# Patient Record
Sex: Male | Born: 1990 | Race: White | Hispanic: Yes | Marital: Married | Smoking: Never smoker
Health system: Southern US, Community
[De-identification: ages and names within clinical notes are randomized; demographics above are authoritative.]

---
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
## 2011-02-09 ENCOUNTER — Emergency Department (HOSPITAL_COMMUNITY): Payer: Self-pay

## 2011-02-09 ENCOUNTER — Emergency Department (HOSPITAL_COMMUNITY)
Admission: EM | Admit: 2011-02-09 | Discharge: 2011-02-09 | Disposition: A | Discharge status: Home / Self Care | Payer: Self-pay | Attending: Emergency Medicine | Admitting: Emergency Medicine

## 2011-02-09 DIAGNOSIS — R10816 Epigastric abdominal tenderness: Secondary | ICD-10-CM | POA: Insufficient documentation

## 2011-02-09 DIAGNOSIS — K3189 Other diseases of stomach and duodenum: Secondary | ICD-10-CM | POA: Insufficient documentation

## 2011-02-09 DIAGNOSIS — R51 Headache: Secondary | ICD-10-CM | POA: Insufficient documentation

## 2011-02-09 DIAGNOSIS — R110 Nausea: Secondary | ICD-10-CM | POA: Insufficient documentation

## 2011-02-09 DIAGNOSIS — R1013 Epigastric pain: Secondary | ICD-10-CM | POA: Insufficient documentation

## 2011-02-09 DIAGNOSIS — R109 Unspecified abdominal pain: Secondary | ICD-10-CM

## 2011-02-09 DIAGNOSIS — R748 Abnormal levels of other serum enzymes: Secondary | ICD-10-CM | POA: Insufficient documentation

## 2011-02-09 DIAGNOSIS — M549 Dorsalgia, unspecified: Secondary | ICD-10-CM | POA: Insufficient documentation

## 2011-02-09 LAB — DIFFERENTIAL
Basophils Absolute: 0 10*3/uL (ref 0.0–0.1)
Basophils Relative: 0 % (ref 0–1)
Lymphs Abs: 1.8 10*3/uL (ref 0.7–4.0)
Monocytes Absolute: 0.6 10*3/uL (ref 0.1–1.0)
Monocytes Relative: 10 % (ref 3–12)
Neutro Abs: 3.7 10*3/uL (ref 1.7–7.7)
Neutrophils Relative %: 59 % (ref 43–77)

## 2011-02-09 LAB — LIPASE, BLOOD: Lipase: 653 U/L — ABNORMAL HIGH (ref 11–59)

## 2011-02-09 LAB — CBC
HCT: 39.4 % (ref 39.0–52.0)
Hemoglobin: 13.8 g/dL (ref 13.0–17.0)
MCH: 30.3 pg (ref 26.0–34.0)
MCHC: 35 g/dL (ref 30.0–36.0)
MCV: 86.6 fL (ref 78.0–100.0)
Platelets: 122 10*3/uL — ABNORMAL LOW (ref 150–400)
RBC: 4.55 MIL/uL (ref 4.22–5.81)
RDW: 12 % (ref 11.5–15.5)
WBC: 6.2 10*3/uL (ref 4.0–10.5)

## 2011-02-09 LAB — COMPREHENSIVE METABOLIC PANEL
Albumin: 4 g/dL (ref 3.5–5.2)
Alkaline Phosphatase: 76 U/L (ref 39–117)
BUN: 12 mg/dL (ref 6–23)
CO2: 26 mEq/L (ref 19–32)
Calcium: 9 mg/dL (ref 8.4–10.5)
Chloride: 104 mEq/L (ref 96–112)
Creatinine, Ser: 0.71 mg/dL (ref 0.50–1.35)
GFR calc Af Amer: >90 mL/min (ref >90)
GFR calc non Af Amer: >90 mL/min (ref >90)
Glucose, Bld: 111 mg/dL — ABNORMAL HIGH (ref 70–99)
Potassium: 3.3 mEq/L — ABNORMAL LOW (ref 3.5–5.1)
Sodium: 138 mEq/L (ref 135–145)
Total Bilirubin: 0.3 mg/dL (ref 0.3–1.2)

## 2011-02-09 MED ORDER — GI COCKTAIL ~~LOC~~
ORAL | Status: AC
  Filled 2011-02-09: qty 30

## 2011-02-09 MED ORDER — OMEPRAZOLE 20 MG PO CPDR: DELAYED_RELEASE_CAPSULE | ORAL | Status: DC

## 2011-02-09 MED ORDER — GI COCKTAIL ~~LOC~~
30.0000 mL | Freq: Once | ORAL | Status: AC
  Administered 2011-02-09: 30 mL via ORAL

## 2011-02-09 NOTE — ED Provider Notes (Signed)
Medical screening examination/treatment/procedure(s) were performed by non-physician practitioner and as supervising physician I was immediately available for consultation/collaboration.   Glynn Octave, MD 02/09/11 1048

## 2011-02-09 NOTE — ED Provider Notes (Signed)
The patient is feeling well. Abdomen is minimally tender. Will discharge home with primary care referrals. Give Prilosec for symptoms of dyspepsia.   Rodena Medin, PA 02/09/11 [phone]

## 2011-02-09 NOTE — ED Provider Notes (Signed)
History     CSN: [id number] Arrival date & time: 02/09/2011  5:27 AM   First MD Initiated Contact with Patient 02/09/11 0529      No chief complaint on file.   (Consider location/radiation/quality/duration/timing/severity/associated sxs/prior treatment) HPI Comments: Patient does admit to using a BC powder for headaches several days ago but generally does not use over-the-counter pain medicines. He denies any changes in his bowel habits including blood in stool or dark tarry stools. The pain is epigastric in location it is intermittent over the last 3 days it seems to get worse at night when he lays down for the night. He has radiation to his back is burning in quality it seems to get better when he gets up and about and walks around. He denies pain with eating  Patient is a 20 y.o. male presenting with abdominal pain. The history is provided by the patient.  Abdominal Pain The primary symptoms of the illness include abdominal pain and nausea. The primary symptoms of the illness do not include fever, fatigue, shortness of breath, vomiting, diarrhea, hematemesis, hematochezia or dysuria. Episode onset: 3 days ago. The onset of the illness was gradual. The problem has been gradually worsening.  The pain came on gradually. The abdominal pain has been gradually worsening since its onset. The abdominal pain is located in the epigastric region. Pain radiation: back. The severity of the abdominal pain is 5/10. Relieved by: sitting up and walking. Exacerbated by: supine position and time of night (after 5 PM gets worse)    No past medical history on file.  No past surgical history on file.  No family history on file.  History  Substance Use Topics  . Smoking status: Not on file  . Smokeless tobacco: Not on file  . Alcohol Use: Not on file      Review of Systems  Constitutional: Negative for fever and fatigue.  Respiratory: Negative for shortness of breath.   Gastrointestinal: Positive  for nausea and abdominal pain. Negative for vomiting, diarrhea, hematochezia and hematemesis.  Genitourinary: Negative for dysuria.  All other systems reviewed and are negative.    Allergies  Review of patient's allergies indicates not on file.  Home Medications  No current outpatient prescriptions on file.  BP 147/75  Pulse 57  Temp(Src) 97.9 F (36.6 C) (Oral)  Resp 18  SpO2 99%  Physical Exam  Nursing note and vitals reviewed. Constitutional: He appears well-developed and well-nourished. No distress.  HENT:  Head: Normocephalic and atraumatic.  Mouth/Throat: Oropharynx is clear and moist. No oropharyngeal exudate.  Eyes: Conjunctivae and EOM are normal. Pupils are equal, round, and reactive to light. Right eye exhibits no discharge. Left eye exhibits no discharge. No scleral icterus.  Neck: Normal range of motion. Neck supple. No JVD present. No thyromegaly present.  Cardiovascular: Normal rate, regular rhythm, normal heart sounds and intact distal pulses.  Exam reveals no gallop and no friction rub.   No murmur heard. Pulmonary/Chest: Effort normal and breath sounds normal. No respiratory distress. He has no wheezes. He has no rales.  Abdominal: Soft. Bowel sounds are normal. He exhibits no distension and no mass. There is tenderness ( Mild epigastric tenderness to palpation without masses. No right upper quadrant, right lower quadrant tenderness, non-peritoneal). There is no rebound and no guarding.  Musculoskeletal: Normal range of motion. He exhibits no edema and no tenderness.  Lymphadenopathy:    He has no cervical adenopathy.  Neurological: He is alert. Coordination normal.  Skin: Skin  is warm and dry. No rash noted. No erythema.  Psychiatric: He has a normal mood and affect. His behavior is normal.    ED Course  Procedures (including critical care time)   Labs Reviewed  CBC  DIFFERENTIAL  COMPREHENSIVE METABOLIC PANEL  LIPASE, BLOOD   No results  found.   No diagnosis found.    MDM  Overall patient is well appearing with normal vital signs. Will attempt a GI cocktail and laboratory workup to rule out pancreatitis or cholecystitis though less likely.    Pt states that his pain in the epigastrium and chest is gone almost immediately with the GI cocktail but has persistent back pain - mild.  Lipase elevated c/w pancreatitis - US pending to r/o other source of pancreatitis.  Change of shift - care signed out to Dr. Carlena Bjornstad, MD 02/09/11 [phone]

## 2011-04-24 ENCOUNTER — Emergency Department (HOSPITAL_COMMUNITY)
Admission: EM | Admit: 2011-04-24 | Discharge: 2011-04-24 | Disposition: A | Discharge status: Home / Self Care | Payer: Self-pay | Attending: Emergency Medicine | Admitting: Emergency Medicine

## 2011-04-24 DIAGNOSIS — F1010 Alcohol abuse, uncomplicated: Secondary | ICD-10-CM | POA: Insufficient documentation

## 2011-04-24 DIAGNOSIS — F10929 Alcohol use, unspecified with intoxication, unspecified: Secondary | ICD-10-CM

## 2011-04-24 MED ORDER — ONDANSETRON 8 MG PO TBDP
8.0000 mg | ORAL_TABLET | ORAL | Status: AC
  Administered 2011-04-24: 8 mg via ORAL
  Filled 2011-04-24: qty 1

## 2011-04-24 NOTE — ED Provider Notes (Signed)
History     CSN: [id number]  Arrival date & time 04/24/11  2130   First MD Initiated Contact with Patient 04/24/11 [phone]      Chief Complaint  Patient presents with  . Alcohol Intoxication    (Consider location/radiation/quality/duration/timing/severity/associated sxs/prior treatment) HPI Patient presenting with alcohol intoxication. Per family and patient this was his 20st birthday and he drank several shots of liquor. Family became concerned when he became less responsive and had an episode of emesis. Patient endorses drink alcohol denies using any other substances. He had no other symptoms of illness prior to drink alcohol. He denies any fevers or chills or abdominal pain. Upon evaluation in the ED is somnolent but awake and answering questions. However he is not able to contribute much further to the history. There is no history of, or head injury.  No past medical history on file.  No past surgical history on file.  No family history on file.  History  Substance Use Topics  . Smoking status: Never Smoker   . Smokeless tobacco: Not on file  . Alcohol Use: No      Review of Systems ROS reviewed and otherwise negative except for mentioned in HPI  Allergies  Review of patient's allergies indicates no known allergies.  Home Medications  No current outpatient prescriptions on file.  BP 118/70  Pulse 87  Temp(Src) 97.8 F (36.6 C) (Oral)  Resp 18  SpO2 99% Vitals reviewed Physical Exam Physical Examination: General appearance - alert, sleepy but otherwise well appearing, and in no distress Mental status - alert, oriented to person, place, and time Head- NCAT Eyes - pupils equal and reactive, no conjunctival injection or scleral icterus Mouth - mucous membranes moist, pharynx normal without lesions Chest - clear to auscultation, no wheezes, rales or rhonchi, symmetric air entry Heart - normal rate, regular rhythm, normal S1, S2, no murmurs, rubs, clicks or  gallops Abdomen - soft, nontender, nondistended, no masses or organomegaly Neurological - alert, oriented, normal speech, no focal findings, gait normal Musculoskeletal - no joint tenderness, deformity or swelling Extremities - peripheral pulses normal, no pedal edema, no clubbing or cyanosis Skin - normal coloration and turgor, no rashes, brisk cap refill  ED Course  Procedures (including critical care time) 5:24 AM- Pt awake, ambulating, has tolerated fluid trial in the ED.  Pt has family members here who will go home with him.  Given zofran for nausea  Labs Reviewed - No data to display No results found.   1. Alcohol intoxication       MDM  Patient presents after report of alcohol ingestion with resultant somnolence and an episode of emesis. Upon evaluation in the ED he is sleeping but easily arousable. He has tolerated fluid orally without any vomiting. He's up and laboratory. There is no suspicion by history or physical for any trauma or other serious acute medical condition at this time. There family members at the bedside who agreed to take him home. He was discharged with strict return precautions and is agreeable with the plan.        Ethelda Chick, MD 04/25/11 [phone]

## 2011-04-24 NOTE — ED Notes (Signed)
EMS called home.  Found patient throwing up in towel.   Family states that patient had been drinking and became  Less responsive.  BP 112/80 HR 76 RR 16

## 2011-04-24 NOTE — ED Notes (Signed)
Patient is alert and oriented x3.  He was given DC instructions and follow up visit instructions.  Patient gave verbal understanding.  He was DC ambulatory under his own power to home.  V/S stable.  He was not showing any signs of distress on DC 

## 2012-03-27 ENCOUNTER — Encounter (HOSPITAL_COMMUNITY): Payer: Self-pay | Admitting: *Deleted

## 2012-03-27 ENCOUNTER — Emergency Department (HOSPITAL_COMMUNITY)
Admission: EM | Admit: 2012-03-27 | Discharge: 2012-03-27 | Disposition: A | Discharge status: Home / Self Care | Payer: Self-pay | Attending: Emergency Medicine | Admitting: Emergency Medicine

## 2012-03-27 DIAGNOSIS — H16139 Photokeratitis, unspecified eye: Secondary | ICD-10-CM | POA: Insufficient documentation

## 2012-03-27 DIAGNOSIS — H5789 Other specified disorders of eye and adnexa: Secondary | ICD-10-CM | POA: Insufficient documentation

## 2012-03-27 DIAGNOSIS — R51 Headache: Secondary | ICD-10-CM | POA: Insufficient documentation

## 2012-03-27 DIAGNOSIS — H53149 Visual discomfort, unspecified: Secondary | ICD-10-CM | POA: Insufficient documentation

## 2012-03-27 MED ORDER — OXYCODONE-ACETAMINOPHEN 5-325 MG PO TABS
2.0000 | ORAL_TABLET | Freq: Once | ORAL | Status: AC
  Administered 2012-03-27: 2 via ORAL
  Filled 2012-03-27: qty 2

## 2012-03-27 MED ORDER — TETRACAINE HCL 0.5 % OP SOLN: 2.0000 [drp] | Freq: Once | OPHTHALMIC | Status: DC

## 2012-03-27 MED ORDER — OXYCODONE-ACETAMINOPHEN 5-325 MG PO TABS: 1.0000 | ORAL_TABLET | ORAL | Status: DC | PRN

## 2012-03-27 MED ORDER — FLUORESCEIN SODIUM 1 MG OP STRP: 1.0000 | ORAL_STRIP | Freq: Once | OPHTHALMIC | Status: DC

## 2012-03-27 MED ORDER — TOBRAMYCIN 0.3 % OP SOLN
2.0000 [drp] | OPHTHALMIC | Status: DC
  Administered 2012-03-27: 2 [drp] via OPHTHALMIC
  Filled 2012-03-27: qty 5

## 2012-03-27 MED ORDER — FLUORESCEIN SODIUM 1 MG OP STRP
ORAL_STRIP | OPHTHALMIC | Status: AC
  Filled 2012-03-27: qty 2

## 2012-03-27 MED ORDER — TETRACAINE HCL 0.5 % OP SOLN
OPHTHALMIC | Status: AC
  Filled 2012-03-27: qty 2

## 2012-03-27 NOTE — ED Provider Notes (Signed)
Medical screening examination/treatment/procedure(s) were performed by non-physician practitioner and as supervising physician I was immediately available for consultation/collaboration.  Vayda Dungee M Isac Lincks, MD 03/27/12 0720 

## 2012-03-27 NOTE — ED Provider Notes (Signed)
History     CSN: [id number]  Arrival date & time 03/27/12  0415   First MD Initiated Contact with Patient 03/27/12 0423      Chief Complaint  Patient presents with  . Eye Pain   HPI  History provided by the patient. Patient is a 22 year old male with no significant PMH who presents with complaints of bilateral high pain and redness. 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 He had a tinted face shield. He denies having any foreign objects in his face or eye area. Patient went home and several hours after returning home to have increasing pain and irritation to both eyes. Patient tried using eyedrops without any improvements. He has not used any other treatments for symptoms. Pain is worse with bright lights. Denies any other aggravating factors. Denies any other associated symptoms. Denies double vision, dizziness, nausea or vomiting.    History reviewed. No pertinent past medical history.  History reviewed. No pertinent past surgical history.  History reviewed. No pertinent family history.  History  Substance Use Topics  . Smoking status: Never Smoker   . Smokeless tobacco: Not on file  . Alcohol Use: No      Review of Systems  Eyes: Positive for photophobia, pain and redness. Negative for itching.  Neurological: Positive for headaches. Negative for dizziness and light-headedness.  All other systems reviewed and are negative.    Allergies  Review of patient's allergies indicates no known allergies.  Home Medications  No current outpatient prescriptions on file.  BP 120/74  Pulse 86  Temp 98 F (36.7 C) (Oral)  Resp 16  SpO2 100%  Physical Exam  Nursing note and vitals reviewed. Constitutional: He appears well-developed and well-nourished. No distress.  HENT:  Head: Normocephalic.  Eyes: EOM are normal. Pupils are equal, round, and reactive to light. No foreign bodies found. Right conjunctiva is  injected. Right conjunctiva has no hemorrhage. Left conjunctiva is injected. Left conjunctiva has no hemorrhage.  Slit lamp exam:      The right eye shows fluorescein uptake. The right eye shows no corneal abrasion, no foreign body, no hyphema and no hypopyon.       The left eye shows fluorescein uptake. The left eye shows no corneal abrasion, no foreign body, no hyphema and no hypopyon.       Diffuse speckled fluorescein uptake over bilateral corneas.  Cardiovascular: Normal rate and regular rhythm.   No murmur heard. Pulmonary/Chest: Effort normal and breath sounds normal.  Neurological: He is alert.  Skin: Skin is warm.  Psychiatric: He has a normal mood and affect. His behavior is normal.    ED Course  Procedures     1. Ultraviolet keratitis       MDM  4:30 AM patient seen and evaluated. Patient appears uncomfortable with sweatshirt hood over his eyes.   Patient with significant improvement after tetracaine use. 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 Patient given tobramycin eye drops, prescriptions for Percocet and ophthalmology referral.       Angus Seller, [place] 03/27/12 [phone]

## 2012-03-27 NOTE — ED Notes (Signed)
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 Pt states that the irriation last for several hours so he decided to come in. Both eyes are irrated.

## 2013-01-11 IMAGING — US US ABDOMEN COMPLETE
1 series · 14 of 25 positions shown · non-contrast
Comparison: None

CLINICAL DATA: Pancreatitis

ULTRASOUND ABDOMEN:
TECHNIQUE: Sonography of upper abdominal structures was performed.

[Series 1: us abdomen complete · 0.26mm/px · 14 of 83 slices shown]
[im 1/83]
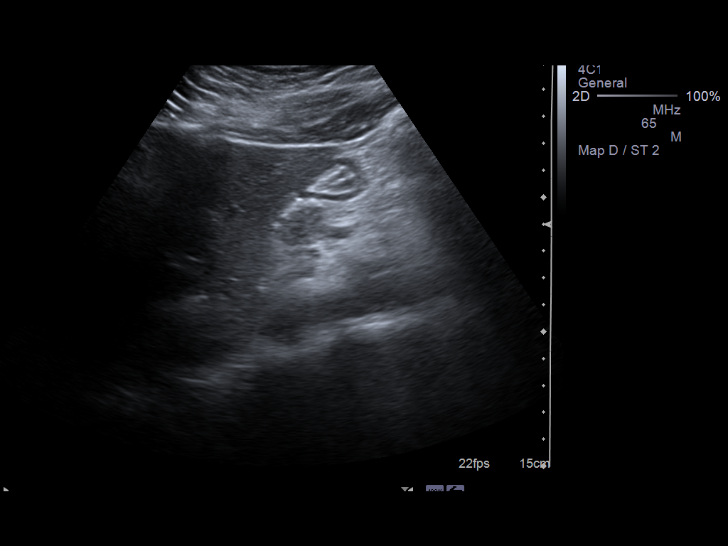
[im 7/83]
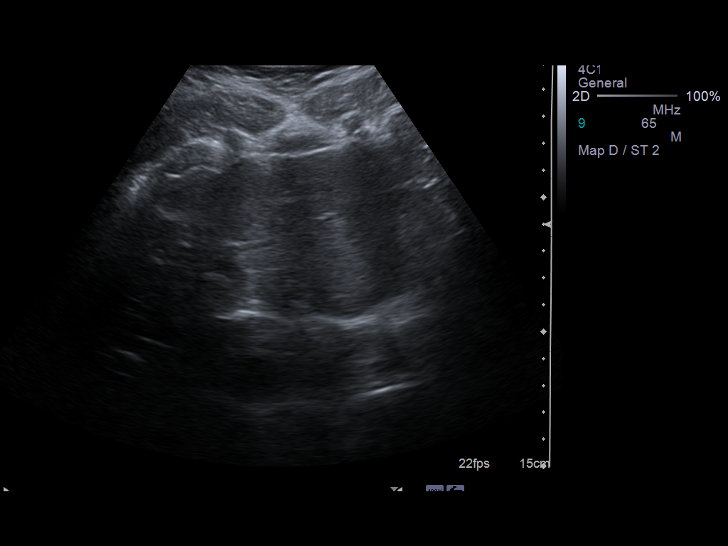
[im 14/83]
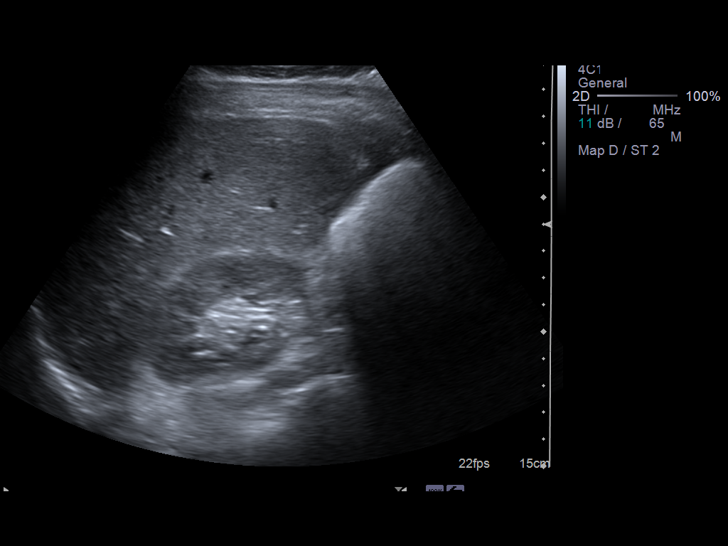
[im 21/83]
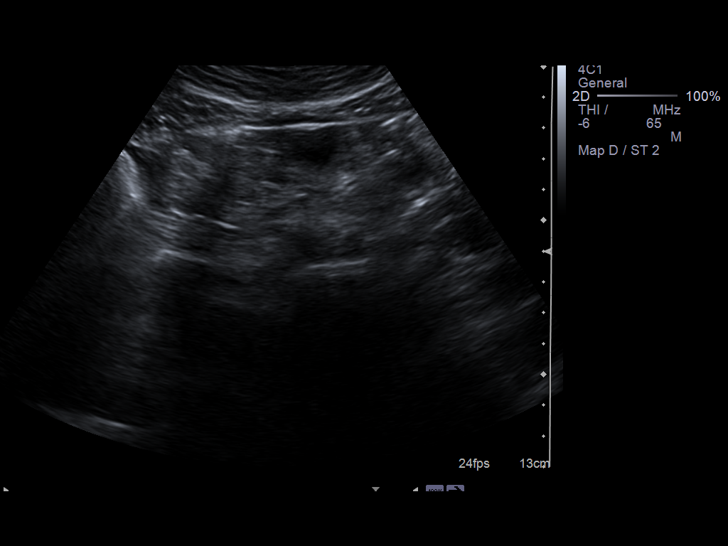
[im 28/83]
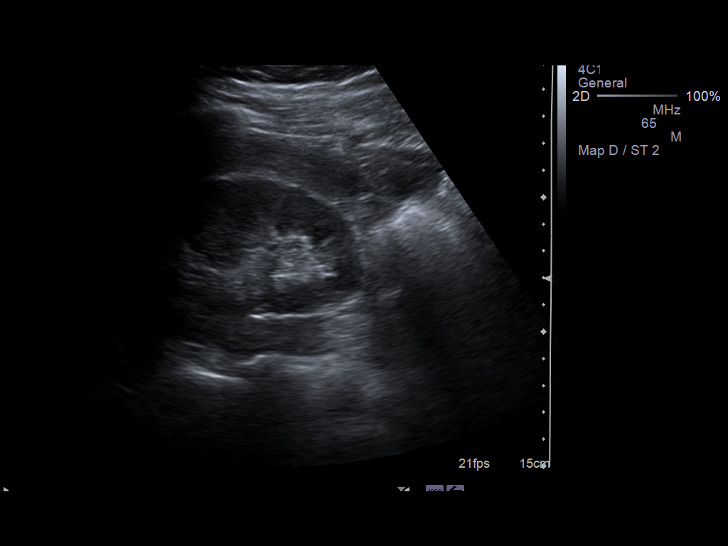
[im 31/83]
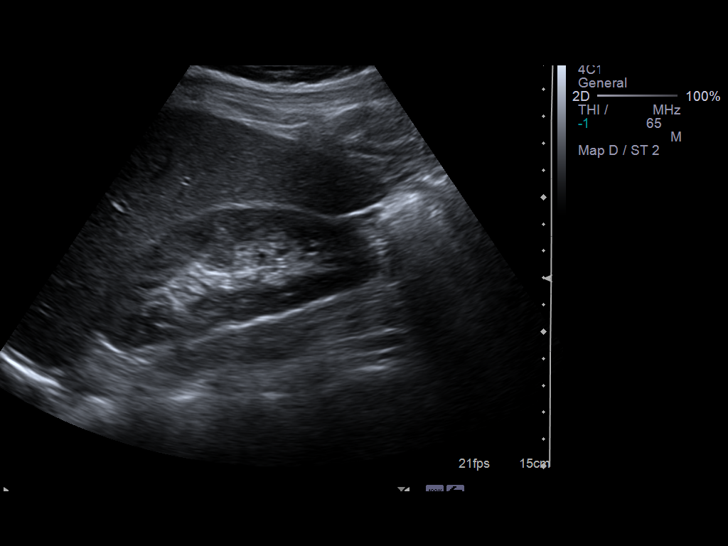
[im 38/83]
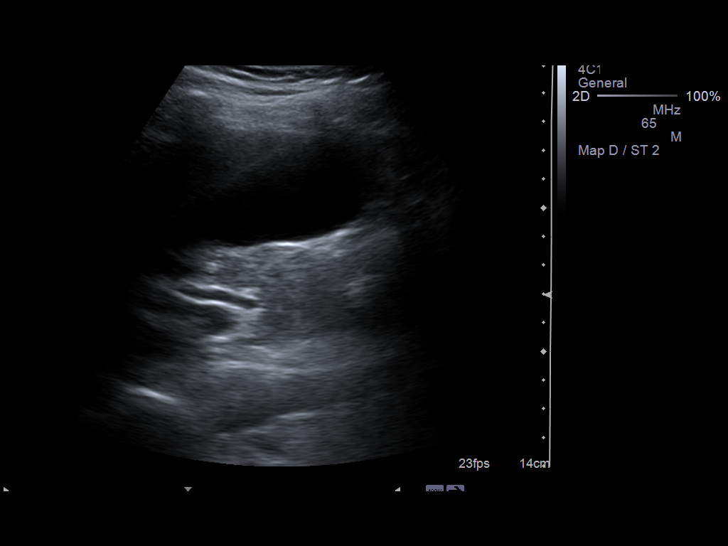
[im 45/83]
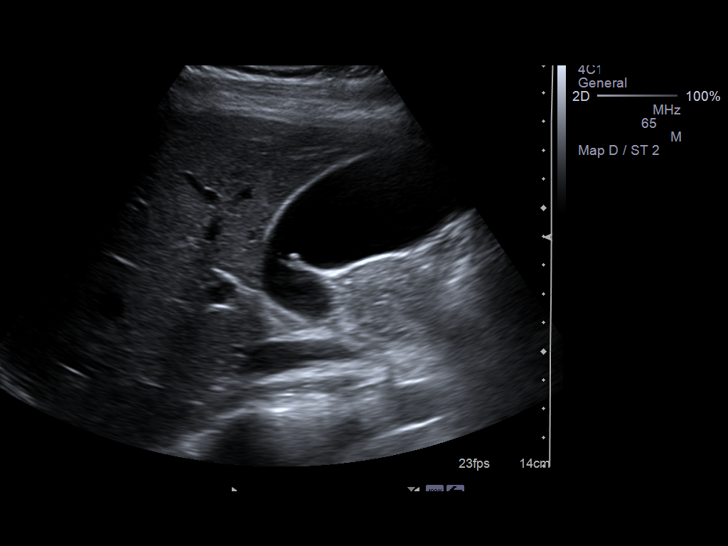
[im 52/83]
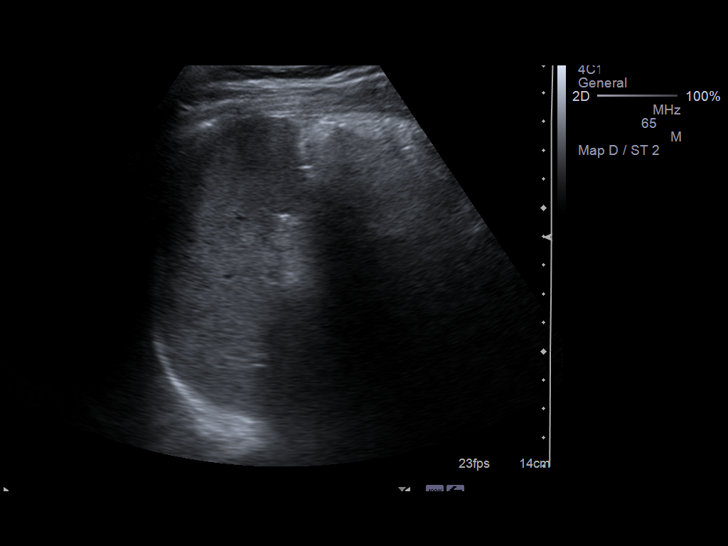
[im 55/83]
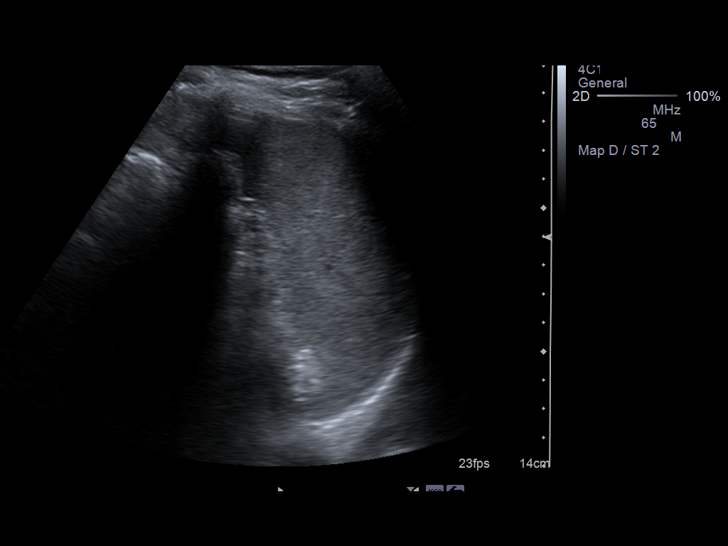
[im 62/83]
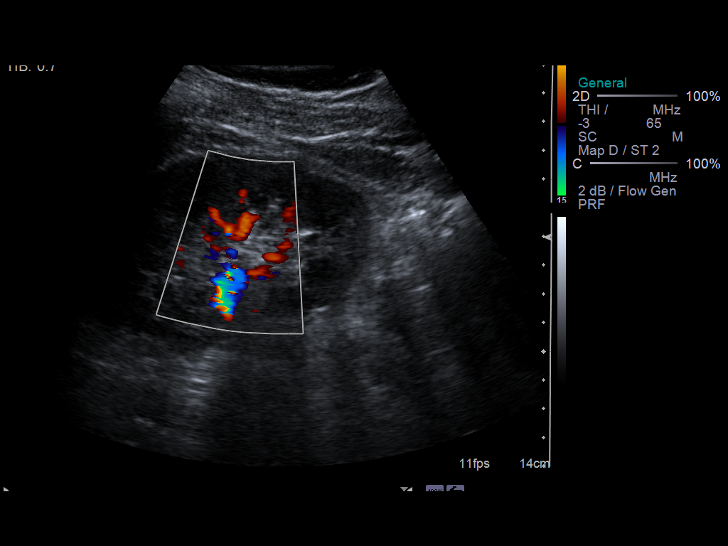
[im 69/83]
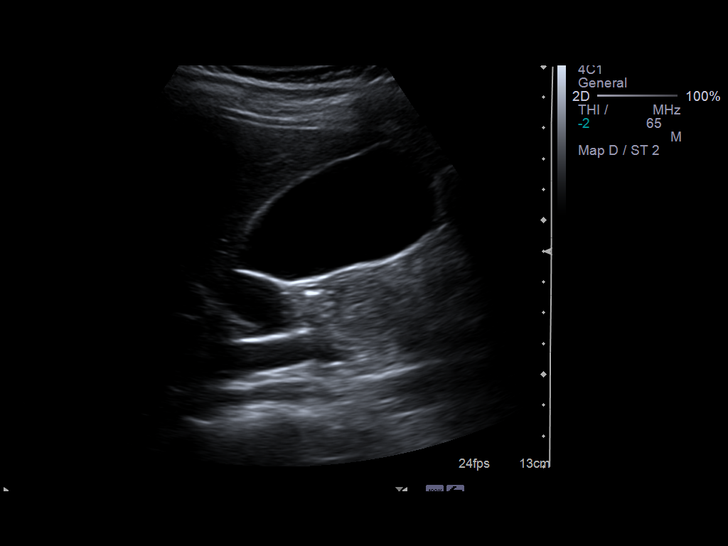
[im 76/83]
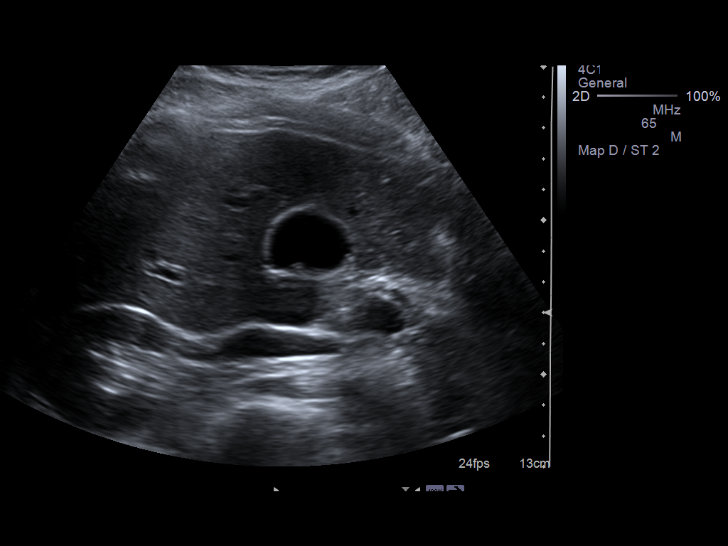
[im 83/83]
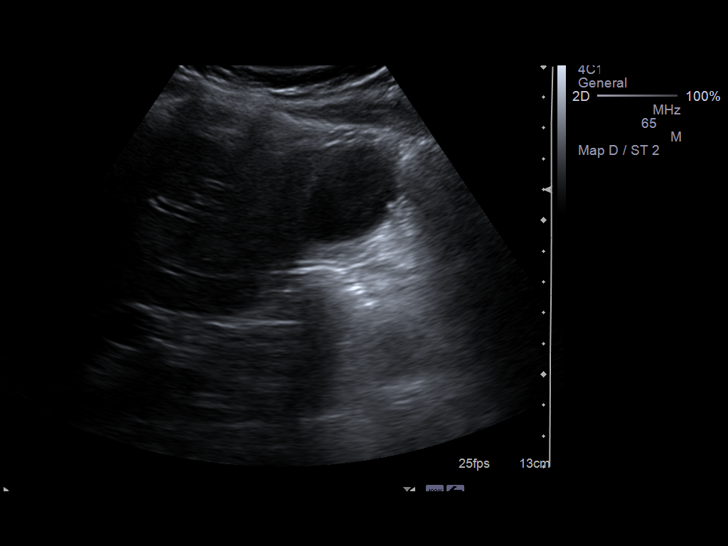

[14 of 25 positions shown; findings below may reference images not displayed]

Gallbladder:  Two gallbladder polyps 3 and 4 mm in diameter, non
mobile and nonshadowing.  No gallbladder wall thickening, definite
shadowing calculi, pericholecystic fluid, or sonographic Murphy's
sign.

Common bile duct:  Normal caliber 5 mm diameter

Liver:  Normal appearance

IVC:  Normal appearance

Pancreas:  Head, body, and proximal tail normal appearance, distal
tail obscured by bowel gas.

Spleen:  Normal appearance, 6.7 cm length

Right kidney:  10.7 cm length. Normal morphology without mass or
hydronephrosis.

Left kidney:  10.3 cm length. Normal morphology without mass or
hydronephrosis.

Aorta:  Normal caliber

Other:  No free fluid
IMPRESSION: Tiny gallbladder polyps.
Nonvisualization of distal pancreatic tail due to bowel gas.
Otherwise negative exam.

## 2014-12-29 ENCOUNTER — Encounter (HOSPITAL_COMMUNITY): Payer: Self-pay | Admitting: Adult Health

## 2014-12-29 ENCOUNTER — Emergency Department (HOSPITAL_COMMUNITY)
Admission: EM | Admit: 2014-12-29 | Discharge: 2014-12-29 | Disposition: A | Discharge status: Home / Self Care | Payer: Self-pay | Attending: Emergency Medicine | Admitting: Emergency Medicine

## 2014-12-29 DIAGNOSIS — F4320 Adjustment disorder, unspecified: Secondary | ICD-10-CM | POA: Insufficient documentation

## 2014-12-29 DIAGNOSIS — F4321 Adjustment disorder with depressed mood: Secondary | ICD-10-CM

## 2014-12-29 MED ORDER — HYDROXYZINE HCL 25 MG PO TABS: ORAL_TABLET | ORAL | Status: DC

## 2014-12-29 MED ORDER — HYDROXYZINE HCL 25 MG PO TABS
50.0000 mg | ORAL_TABLET | Freq: Once | ORAL | Status: AC
  Administered 2014-12-29: 50 mg via ORAL
  Filled 2014-12-29: qty 2

## 2014-12-29 NOTE — ED Notes (Signed)
Family lost father last night at our facility and is currently grieving-pt denies wanting to harm self or others. Tearful and anxious and having trouble sleeping since death last night.

## 2014-12-29 NOTE — Discharge Instructions (Signed)
You appear to be having a normal grief response. We are giving you medication that should help you sleep and get some rest at night. We are giving you information on Complicated Grief in the event your symptoms do not resolve. If your symptoms worsen return as needed.

## 2014-12-29 NOTE — ED Provider Notes (Signed)
CSN: [id number]     Arrival date & time 12/29/14  2058 History  By signing my name below, I, Jarvis Morgan, attest that this documentation has been prepared under the direction and in the presence of Kerrie Buffalo, NP Electronically Signed: Jarvis Morgan, ED Scribe. 12/29/2014. 10:45 PM.     Chief Complaint  Patient presents with  . Anxiety   The history is provided by the patient. No language interpreter was used.    HPI Comments: Shane Olson is a 24 y.o. male with no PMHx who presents to the Emergency Department complaining of sudden onset, gradually worsening anxiety that began last s/p death of pt's father. Pt states he lost his father last night at [facility] and it was unexpected. He endorses he is having associated difficulty sleeping and vomiting 1x last night. Pt has not had any vomiting since. He has also not been eating or drinking much in the past 24 hours.Pt's father was 34 years old and there is no known cause of death at this time. He had no chronic health problems that they were aware of. He denies any SI, HI, chest pain or SOB. He denies any known medical allergies.   History reviewed. No pertinent past medical history. History reviewed. No pertinent past surgical history. History reviewed. No pertinent family history. Social History  Substance Use Topics  . Smoking status: Never Smoker   . Smokeless tobacco: None  . Alcohol Use: No    Review of Systems  Respiratory: Negative for shortness of breath.   Cardiovascular: Negative for chest pain.  Gastrointestinal: Positive for vomiting (1x; now resolved).  Psychiatric/Behavioral: Positive for sleep disturbance. Negative for suicidal ideas. The patient is nervous/anxious.   All other systems reviewed and are negative.     Allergies  Review of patient's allergies indicates no known allergies.  Home Medications   Prior to Admission medications   Medication Sig Start Date End Date Taking? Authorizing Provider   hydrOXYzine (ATARAX/VISTARIL) 25 MG tablet Take one tablet PO at bedtime and may repeat once if needed. For anxiety and grief reaction 12/29/14   Janne Napoleon, NP   Triage Vitals: BP 108/92 mmHg  Pulse 85  Temp(Src) 98.1 F (36.7 C) (Oral)  Resp 16  SpO2 98%  Physical Exam  Constitutional: He is oriented to person, place, and time. He appears well-developed and well-nourished. No distress.  HENT:  Head: Normocephalic and atraumatic.  Eyes: EOM are normal. Pupils are equal, round, and reactive to light.  Sclera is erythematous due to crying Upper and lower lids with mild edema  Neck: Neck supple.  Cardiovascular: Normal rate, regular rhythm and normal heart sounds.   Pulmonary/Chest: Effort normal and breath sounds normal. He has no wheezes. He has no rales.  Abdominal: Soft. There is no tenderness.  Musculoskeletal: Normal range of motion.  Neurological: He is alert and oriented to person, place, and time. No cranial nerve deficit.  Skin: Skin is warm and dry.  Psychiatric: His speech is normal. Thought content normal. Cognition and memory are normal.  Pt is tearful and sad Appropriate behavior for dealing with loss.   Nursing note and vitals reviewed.   ED Course  Procedures (including critical care time)  DIAGNOSTIC STUDIES: Oxygen Saturation is 98% on RA, normal by my interpretation.    COORDINATION OF CARE:  10:20 PM- will d/c pt with rx for Atarax for mgmt of his grief and anxiety.  Pt advised of plan for treatment and pt agrees.  MDM  24 y.o. male with difficulty sleeping s/p death of his father last evening. Stable for d/c with Atarax to try and help with sleep. Discussed with the patient grief process, counseling and follow up. Patient voices understanding. Stable for d/c without S/I or H/I.   Final diagnoses:  Grief reaction   I personally performed the services described in this documentation, which was scribed in my presence. The recorded information has  been reviewed and is accurate.   [address] [place], NP 12/29/14 1610  Lyndal Pulley, MD 12/30/14 Moses Manners

## 2015-03-08 ENCOUNTER — Emergency Department (HOSPITAL_COMMUNITY)
Admission: EM | Admit: 2015-03-08 | Discharge: 2015-03-08 | Disposition: A | Discharge status: Other Acute Inpatient Hospital | Payer: Self-pay | Attending: Emergency Medicine | Admitting: Emergency Medicine

## 2015-03-08 ENCOUNTER — Encounter (HOSPITAL_COMMUNITY): Payer: Self-pay | Admitting: Family Medicine

## 2015-03-08 ENCOUNTER — Inpatient Hospital Stay (HOSPITAL_COMMUNITY)
Admission: AD | Admit: 2015-03-08 | Discharge: 2015-03-11 | DRG: 885 | Disposition: A | Discharge status: Home / Self Care | Payer: Federal, State, Local not specified - Other | Source: Intra-hospital | Attending: Psychiatry | Admitting: Psychiatry

## 2015-03-08 DIAGNOSIS — F919 Conduct disorder, unspecified: Secondary | ICD-10-CM | POA: Insufficient documentation

## 2015-03-08 DIAGNOSIS — F419 Anxiety disorder, unspecified: Secondary | ICD-10-CM | POA: Diagnosis present

## 2015-03-08 DIAGNOSIS — S0081XA Abrasion of other part of head, initial encounter: Secondary | ICD-10-CM | POA: Insufficient documentation

## 2015-03-08 DIAGNOSIS — Y998 Other external cause status: Secondary | ICD-10-CM | POA: Insufficient documentation

## 2015-03-08 DIAGNOSIS — Z23 Encounter for immunization: Secondary | ICD-10-CM | POA: Insufficient documentation

## 2015-03-08 DIAGNOSIS — Y92009 Unspecified place in unspecified non-institutional (private) residence as the place of occurrence of the external cause: Secondary | ICD-10-CM | POA: Insufficient documentation

## 2015-03-08 DIAGNOSIS — G4700 Insomnia, unspecified: Secondary | ICD-10-CM | POA: Diagnosis present

## 2015-03-08 DIAGNOSIS — F1520 Other stimulant dependence, uncomplicated: Secondary | ICD-10-CM

## 2015-03-08 DIAGNOSIS — F329 Major depressive disorder, single episode, unspecified: Secondary | ICD-10-CM | POA: Diagnosis not present

## 2015-03-08 DIAGNOSIS — F1410 Cocaine abuse, uncomplicated: Secondary | ICD-10-CM | POA: Insufficient documentation

## 2015-03-08 DIAGNOSIS — F1590 Other stimulant use, unspecified, uncomplicated: Secondary | ICD-10-CM | POA: Diagnosis not present

## 2015-03-08 DIAGNOSIS — F1420 Cocaine dependence, uncomplicated: Secondary | ICD-10-CM | POA: Diagnosis not present

## 2015-03-08 DIAGNOSIS — Y9389 Activity, other specified: Secondary | ICD-10-CM | POA: Insufficient documentation

## 2015-03-08 DIAGNOSIS — R454 Irritability and anger: Secondary | ICD-10-CM | POA: Insufficient documentation

## 2015-03-08 DIAGNOSIS — F322 Major depressive disorder, single episode, severe without psychotic features: Secondary | ICD-10-CM | POA: Diagnosis not present

## 2015-03-08 DIAGNOSIS — F1510 Other stimulant abuse, uncomplicated: Secondary | ICD-10-CM | POA: Insufficient documentation

## 2015-03-08 DIAGNOSIS — S0083XA Contusion of other part of head, initial encounter: Secondary | ICD-10-CM | POA: Insufficient documentation

## 2015-03-08 DIAGNOSIS — R451 Restlessness and agitation: Secondary | ICD-10-CM | POA: Insufficient documentation

## 2015-03-08 DIAGNOSIS — F32A Depression, unspecified: Secondary | ICD-10-CM | POA: Diagnosis present

## 2015-03-08 LAB — COMPREHENSIVE METABOLIC PANEL
ALT: 17 U/L (ref 17–63)
ANION GAP: 10 (ref 5–15)
AST: 24 U/L (ref 15–41)
Albumin: 4.8 g/dL (ref 3.5–5.0)
Alkaline Phosphatase: 65 U/L (ref 38–126)
BUN: 12 mg/dL (ref 6–20)
CHLORIDE: 104 mmol/L (ref 101–111)
CO2: 29 mmol/L (ref 22–32)
CREATININE: 0.94 mg/dL (ref 0.61–1.24)
Calcium: 9.5 mg/dL (ref 8.9–10.3)
Glucose, Bld: 115 mg/dL — ABNORMAL HIGH (ref 65–99)
POTASSIUM: 3.4 mmol/L — AB (ref 3.5–5.1)
SODIUM: 143 mmol/L (ref 135–145)
Total Bilirubin: 1.1 mg/dL (ref 0.3–1.2)
Total Protein: 8 g/dL (ref 6.5–8.1)

## 2015-03-08 LAB — CBC
HCT: 42.9 % (ref 39.0–52.0)
Hemoglobin: 14.9 g/dL (ref 13.0–17.0)
MCH: 31.2 pg (ref 26.0–34.0)
MCHC: 34.7 g/dL (ref 30.0–36.0)
MCV: 89.7 fL (ref 78.0–100.0)
PLATELETS: 166 10*3/uL (ref 150–400)
RBC: 4.78 MIL/uL (ref 4.22–5.81)
RDW: 12.4 % (ref 11.5–15.5)
WBC: 9.8 10*3/uL (ref 4.0–10.5)

## 2015-03-08 LAB — RAPID URINE DRUG SCREEN, HOSP PERFORMED
AMPHETAMINES: POSITIVE — AB
Barbiturates: NOT DETECTED
Benzodiazepines: NOT DETECTED
COCAINE: POSITIVE — AB
OPIATES: NOT DETECTED
Tetrahydrocannabinol: NOT DETECTED

## 2015-03-08 LAB — ETHANOL

## 2015-03-08 LAB — ACETAMINOPHEN LEVEL

## 2015-03-08 LAB — SALICYLATE LEVEL

## 2015-03-08 MED ORDER — NICOTINE 21 MG/24HR TD PT24: 21.0000 mg | MEDICATED_PATCH | Freq: Every day | TRANSDERMAL | Status: DC

## 2015-03-08 MED ORDER — IBUPROFEN 200 MG PO TABS: 600.0000 mg | ORAL_TABLET | Freq: Three times a day (TID) | ORAL | Status: DC | PRN

## 2015-03-08 MED ORDER — ZOLPIDEM TARTRATE 5 MG PO TABS: 5.0000 mg | ORAL_TABLET | Freq: Every evening | ORAL | Status: DC | PRN

## 2015-03-08 MED ORDER — TETANUS-DIPHTH-ACELL PERTUSSIS 5-2.5-18.5 LF-MCG/0.5 IM SUSP
0.5000 mL | Freq: Once | INTRAMUSCULAR | Status: AC
  Administered 2015-03-08: 0.5 mL via INTRAMUSCULAR
  Filled 2015-03-08: qty 0.5

## 2015-03-08 MED ORDER — ONDANSETRON HCL 4 MG PO TABS: 4.0000 mg | ORAL_TABLET | Freq: Three times a day (TID) | ORAL | Status: DC | PRN

## 2015-03-08 MED ORDER — LORAZEPAM 1 MG PO TABS: 1.0000 mg | ORAL_TABLET | Freq: Three times a day (TID) | ORAL | Status: DC | PRN

## 2015-03-08 MED ORDER — ALUM & MAG HYDROXIDE-SIMETH 200-200-20 MG/5ML PO SUSP: 30.0000 mL | ORAL | Status: DC | PRN

## 2015-03-08 MED ORDER — ACETAMINOPHEN 325 MG PO TABS: 650.0000 mg | ORAL_TABLET | ORAL | Status: DC | PRN
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
## 2015-03-08 NOTE — ED Notes (Signed)
Tele psych machine at bedside 
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
## 2015-03-08 NOTE — ED Notes (Signed)
Pt. To [facility] from ED ambulatory without difficulty, to room 37. Pt. Is alert and oriented, warm and dry in no distress. Pt. Denies SI, HI, and AVH. Pt. Calm and cooperative. Pt. Made aware of security cameras and Q15 minute rounds. Pt. Encouraged to let Nursing staff know of any concerns or needs.

## 2015-03-08 NOTE — ED Notes (Addendum)
Pt's belonging include: A pair of Nike tennis shoes Blue jeans, brown belt with $3 bills, wallet with [place] DL, SSN card, BOA debit master card H&R BlockWhite T shirt Black Exelon CorporationHoodie White Socks.   All belongings placed in one white belongings bag and placed at triage nurses station.

## 2015-03-08 NOTE — ED Notes (Signed)
Gave report to Baker Hughes Incorporatedary RN. Patient belongings transported with patient. Pt transported to room 37.

## 2015-03-09 ENCOUNTER — Encounter (HOSPITAL_COMMUNITY): Payer: Self-pay

## 2015-03-09 DIAGNOSIS — F1590 Other stimulant use, unspecified, uncomplicated: Secondary | ICD-10-CM

## 2015-03-09 DIAGNOSIS — F32A Depression, unspecified: Secondary | ICD-10-CM | POA: Diagnosis present

## 2015-03-09 DIAGNOSIS — F1520 Other stimulant dependence, uncomplicated: Secondary | ICD-10-CM

## 2015-03-09 DIAGNOSIS — F322 Major depressive disorder, single episode, severe without psychotic features: Principal | ICD-10-CM

## 2015-03-09 DIAGNOSIS — F329 Major depressive disorder, single episode, unspecified: Secondary | ICD-10-CM | POA: Diagnosis present

## 2015-03-09 DIAGNOSIS — F1420 Cocaine dependence, uncomplicated: Secondary | ICD-10-CM

## 2015-03-09 MED ORDER — TRAZODONE HCL 50 MG PO TABS
50.0000 mg | ORAL_TABLET | Freq: Every evening | ORAL | Status: DC | PRN
  Filled 2015-03-09: qty 1

## 2015-03-09 MED ORDER — POTASSIUM CHLORIDE CRYS ER 20 MEQ PO TBCR
20.0000 meq | EXTENDED_RELEASE_TABLET | Freq: Two times a day (BID) | ORAL | Status: AC
  Administered 2015-03-09 – 2015-03-10 (×2): 20 meq via ORAL
  Filled 2015-03-09 (×3): qty 1

## 2015-03-09 MED ORDER — FLUOXETINE HCL 10 MG PO CAPS
10.0000 mg | ORAL_CAPSULE | Freq: Every day | ORAL | Status: DC
  Administered 2015-03-09 – 2015-03-11 (×3): 10 mg via ORAL
  Filled 2015-03-09 (×7): qty 1

## 2015-03-09 MED ORDER — ACETAMINOPHEN 325 MG PO TABS: 650.0000 mg | ORAL_TABLET | Freq: Four times a day (QID) | ORAL | Status: DC | PRN

## 2015-03-09 MED ORDER — HYDROXYZINE HCL 25 MG PO TABS: 25.0000 mg | ORAL_TABLET | Freq: Three times a day (TID) | ORAL | Status: DC | PRN

## 2015-03-09 MED ORDER — ALUM & MAG HYDROXIDE-SIMETH 200-200-20 MG/5ML PO SUSP: 30.0000 mL | ORAL | Status: DC | PRN

## 2015-03-09 MED ORDER — MAGNESIUM HYDROXIDE 400 MG/5ML PO SUSP: 30.0000 mL | Freq: Every day | ORAL | Status: DC | PRN
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
## 2015-03-09 NOTE — Progress Notes (Addendum)
Patient attended AA meeting.

## 2015-03-09 NOTE — Progress Notes (Signed)
Patient ID: Shane Olson, male   DOB: 05/17/1990, 24 y.o.   MRN: [id number] D: Patient alert and cooperative. Pt reports wife and mother visited this evening. Pt hoping to discharge tomorrow and follow up OP. Pt mood/affect is anxious but pleasant. Pt denies SI/HI/AVH and pain. No acute physical distress noted.   A: Medications administered as prescribed. Emotional support given and will continue to monitor pt's progress.  R: Patient remains safe and complaint with medications.

## 2015-03-09 NOTE — Plan of Care (Signed)
Problem: Consults Goal: Depression Patient Education See Patient Education Module for education specifics.  Outcome: Progressing Nurse discussed depression/coping skills with patient.        

## 2015-03-09 NOTE — [facility] Suicide Risk Assessment (Signed)
[facility]   Nursing information obtained from:  Patient Demographic factors:  Male, Adolescent or young adult, Access to firearms Current Mental Status:  Suicidal ideation indicated by others, Self-harm behaviors Loss Factors:  Loss of significant relationship Historical Factors:  Impulsivity Risk Reduction Factors:  Employed, Positive social support, Positive therapeutic relationship Total Time spent with patient: 45 minutes Principal Problem: Depressive disorder Diagnosis:   Patient Active Problem List   Diagnosis Date Noted  . Depressive disorder [F32.9] 03/09/2015  . Cocaine use disorder, moderate, dependence (HCC) [F14.20] 03/09/2015  . 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  World Science writerHealth Organization [facility](WHO). Score between 0-7:  no or low risk or alcohol related problems. Score between 8-15:  moderate risk of alcohol related problems. Score between 16-19:  high risk of alcohol related problems. Score 20 or above:  warrants further diagnostic evaluation for alcohol dependence and treatment.   CLINICAL FACTORS:   Depression:   Comorbid alcohol abuse/dependence Impulsivity Insomnia Recent sense of peace/wellbeing Alcohol/Substance Abuse/Dependencies Unstable or Poor Therapeutic Relationship   Musculoskeletal: Strength & Muscle Tone: within normal limits Gait & Station: normal Patient leans: N/A  Psychiatric Specialty Exam: Physical Exam  ROS  Blood pressure 124/72, pulse 84, temperature 98.6 F (37 C), temperature source Oral, resp. rate 16, height 5\' 10"  (1.778 m), weight 73.029 kg (161 lb).Body mass index is 23.1 kg/(m^2).  General Appearance: Guarded  Eye Contact::  Fair  Speech:  Slow  Volume:  Decreased  Mood:  Anxious, Depressed and Irritable  Affect:  Constricted and Depressed   Thought Process:  Coherent  Orientation:  Full (Time, Place, and Person)  Thought Content:  Rumination  Suicidal Thoughts:  No  Homicidal Thoughts:  No  Memory:  Immediate;   Fair Recent;   Fair Remote;   Fair  Judgement:  Fair  Insight:  Lacking  Psychomotor Activity:  Decreased  Concentration:  Fair  Recall:  [employer]Fair  Fund of Knowledge:Good  Language: Good  Akathisia:  No  Handed:  Right  AIMS (if indicated):     Assets:  Communication Skills Desire for Improvement Housing Physical Health Social Support Talents/Skills  Sleep:  Number of Hours: 6  Cognition: WNL  ADL's:  Intact     COGNITIVE FEATURES THAT CONTRIBUTE TO RISK:  Thought constriction (tunnel vision)    SUICIDE RISK:   Mild:  Suicidal ideation of limited frequency, intensity, duration, and specificity.  There are no identifiable plans, no associated intent, mild dysphoria and related symptoms, good self-control (both objective and subjective assessment), few other risk factors, and identifiable protective factors, including available and accessible social support.  PLAN OF CARE: Patient is 24 year old man who was [facility].  Patient has arguments with his wife and her brother-in-law.  As per mother he has been depressed since his father died.  He is more isolated, withdrawn and depressed.  Patient admitted feeling overwhelmed and he admitted drinking and using drugs.  His UDS is positive for amphetamines and cocaine.  Patient is willing to try antidepressant to help his mood.  Please see history and physical for more detailed and comprehensive treatment plan.  Medical Decision Making:  Established Problem, Stable/Improving (1), New problem, with additional work up planned, Review of Psycho-Social Stressors (1), Review or order clinical lab tests (1), Decision to obtain old records (  1), Review and summation of old records (2), Established Problem, Worsening (2),  New Problem, with no additional work-up planned (3), Review of Medication Regimen & Side Effects (2) and Review of New Medication or Change in Dosage (2)  I certify that inpatient services furnished can reasonably be expected to improve the patient's condition.   Jese Comella T. 03/09/2015, 3:21 PM

## 2015-03-09 NOTE — Plan of Care (Signed)
Problem: Diagnosis: Increased Risk For Suicide Attempt Goal: STG-Patient Will Attend All Groups On The Unit Outcome: Progressing Pt attended evening AA group     

## 2015-03-09 NOTE — [facility] Suicide Risk Assessment (Signed)
[facility] INPATIENT:  Family/Significant Other Suicide Prevention Education  Suicide Prevention Education:  Patient Refusal for Family/Significant Other Suicide Prevention Education: The patient Shane Olson has refused to provide written consent for family/significant other to be provided Family/Significant Other Suicide Prevention Education during admission and/or prior to discharge.  Physician notified. SPE reviewed with patient and brochure provided. Patient encouraged to return to hospital if having suicidal thoughts, patient verbalized his/her understanding and has no further questions at this time.   Aletha Allebach, [place] CarboKristin L 03/09/2015, 12:48 PM

## 2015-03-09 NOTE — Progress Notes (Addendum)
D:  Patient's self inventory sheet, patient has good sleep, no sleep medication.  Denied depression, hopeless, and anxiety.  Denied withdrawals.  Denied SI.  Denied physical problems,  Denoed pain.  Goal is to make them see I do not have to be here.  Plans to talk to someone so they can see.  Does have discharge plans. A:  No medications scheduled this morning.  Emotional support and encouragement given patient. R:  Denied SI and HI, contracts for safety.  Denied A/V hallucinations.  Safety maintained with 15 minute checks.

## 2015-03-09 NOTE — Progress Notes (Addendum)
Pt attended spiritual care group on grief and loss facilitated by chaplain Geno Sydnor  Group opened with brief discussion and psycho-social ed around grief and loss in relationships and in relation to self - identifying life patterns, circumstances, changes that cause losses. Established group norm of speaking from own life experience. Group goal of establishing open and affirming space for members to share loss and experience with grief, normalize grief experience and provide psycho social education and grief support. Group facilitation drew on brief CBT and Adlerian orientations.  

## 2015-03-09 NOTE — Tx Team (Signed)
Initial Interdisciplinary Treatment Plan   PATIENT STRESSORS: Loss of father Marital or family conflict   PATIENT STRENGTHS: Ability for insight Average or above average intelligence Capable of independent living Supportive family/friends   PROBLEM LIST: Problem List/Patient Goals Date to be addressed Date deferred Reason deferred Estimated date of resolution  depression 03/08/2015     anxiety 03/08/2015     Risk for suicide 03/08/2015     Family conflict 03/08/2015     "saving money" 03/08/2015     Grieving loss of father 03/08/2015                        DISCHARGE CRITERIA:  Ability to meet basic life and health needs Improved stabilization in mood, thinking, and/or behavior Motivation to continue treatment in a less acute level of care Verbal commitment to aftercare and medication compliance  PRELIMINARY DISCHARGE PLAN: Outpatient therapy Participate in family therapy Return to previous living arrangement Return to previous work or school arrangements  PATIENT/FAMIILY INVOLVEMENT: This treatment plan has been presented to and reviewed with the patient, Shane Olson, and/or family member, The patient and family have been given the opportunity to ask questions and make suggestions.  JEHU-APPIAH, Delories Mauri K 03/09/2015, 2:22 AM

## 2015-03-09 NOTE — [facility] Group Notes (Signed)
[facility] LCSW Group Therapy 03/09/2015  1:15 pm  Type of Therapy: Group Therapy Participation Level: Active  Participation Quality: Attentive, Sharing and Supportive  Affect: Appropriate  Cognitive: Alert and Oriented  Insight: Developing/Improving and Engaged  Engagement in Therapy: Developing/Improving and Engaged  Modes of Intervention: Clarification, Confrontation, Discussion, Education, Exploration,  Limit-setting, Orientation, Problem-solving, Rapport Building, Dance movement psychotherapisteality Testing, Socialization and Support  Summary of Progress/Problems: Pt identified obstacles faced currently and processed barriers involved in overcoming these obstacles. Pt identified steps necessary for overcoming these obstacles and explored motivation (internal and external) for facing these difficulties head on. Pt further identified one area of concern in their lives and chose a goal to focus on for today. Patient could not identify any obstacles. He disclosed that he is here receiving treatment in order to appease his family as he knows that they are worried about him and want to see him well. Patient participated minimally in discussion despite CSW encouragement but was observed actively listening.  Shane Olson, MSW, [employer] IncLCSWA Clinical Social Worker [facility] [phone]

## 2015-03-09 NOTE — [facility] Group Notes (Signed)
   [facility]  03/09/2015  8:45 AM   Participation Quality: Alert, Appropriate and Oriented  Mood/Affect: Anxious, blunted   Depression Rating: 0  Anxiety Rating: Reports mild level of anxiety due to missing work  Thoughts of Suicide: Pt denies SI/HI  Will Social research officer, governmentyou contract for safety? Yes  Current AVH: Pt denies  Plan for Discharge/Comments: Pt attended discharge planning group and actively participated in group. CSW provided pt with today's workbook. Patient reports that he plans to return home to follow up with outpatient services at discharge.   Transportation Means: Pt reports access to transportation  Supports: No supports mentioned at this time  Samuella BruinKristin Casanova Schurman, MSW, [employer] IncLCSWA Clinical Social Worker [facility] [phone]
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
## 2015-03-10 DIAGNOSIS — F329 Major depressive disorder, single episode, unspecified: Secondary | ICD-10-CM

## 2015-03-10 NOTE — Progress Notes (Signed)
Adult Psychoeducational Group Note  Date:  03/10/2015 Time:  8:45 PM  Group Topic/Focus:  Wrap-Up Group:   The focus of this group is to help patients review their daily goal of treatment and discuss progress on daily workbooks.  Participation Level:  Active  Participation Quality:  Attentive  Affect:  Appropriate  Cognitive:  Appropriate  Insight: Good  Engagement in Group:  Engaged  Modes of Intervention:  Discussion  Additional Comments:  Pt rated overall day a 10 out of 10 because he "just had a good day all day today" and because he got to see his family, which was his goal for the day.   Cleotilde NeerJasmine S Shyenne Maggard 03/10/2015, 10:13 PM

## 2015-03-10 NOTE — Progress Notes (Signed)
D:  Patient's self inventory sheet, patient sleeps good, no sleep medication given.  Good appetite, normal energy level, good concentration.  Denied depression, hopeless and anxiety.  Denied withdrawals.  Denied SI.  Denied physical problems.  Denied physical pain.  Goal is going home so he can go back to work, pay his bills.  Does have discharge plans.   A:  Medications administered per MD orders.  Emotional support and encouragement given patient. R:  Denied SI and HI, contracts for safety.  Denied A/V hallucinations.  Safety maintained with 15 minute checks.

## 2015-03-10 NOTE — Plan of Care (Signed)
Problem: Consults Goal: Suicide Risk Patient Education (See Patient Education module for education specifics)  Outcome: Progressing Nurse discussed suicide thoughts, depression and coping skills with patient.

## 2015-03-10 NOTE — Progress Notes (Signed)
Patient ID: Shane Olson, male   DOB: 06/06/1990, 24 y.o.   MRN: [id number] D: Patient calm and cooperative. Pt isolative and not interacting well with peers. Pt reports wife visited this evening. Pt hoping to discharge soon. Pt mood/affect is anxious but pleasant.      Pt denies SI/HI/AVH and pain. No acute physical distress noted.  A:  Emotional support given to attend groups and engage in milieu.  R: Patient remains safe.

## 2015-03-10 NOTE — [facility] Group Notes (Signed)
The focus of this group is to educate the patient on the purpose and policies of crisis stabilization and provide a format to answer questions about their admission.  The group details unit policies and expectations of patients while admitted.  Patient attended 0900 nurse education orientation group this morning.  Patient actively participated and had appropriate affect.  Patient is alert.  Patient had appropriate insight and appropriate engagement.  Today patient will work on 3 goals for discharge.  

## 2015-03-10 NOTE — Progress Notes (Signed)
CSW left voicemail for sister Ronnald Nianlizabeth Ayars [phone] to obtain collateral information. CSW awaiting return call.  Samuella BruinKristin Consuella Scurlock, MSW, [employer] Clinical Social Worker [facility] [phone]

## 2015-03-10 NOTE — Progress Notes (Signed)
Recreation Therapy Notes  Animal-Assisted Activity (AAA) Program Checklist/Progress Notes Patient Eligibility Criteria Checklist & Daily Group note for Rec Tx Intervention  Date: 12.20.2016  Time: 2:45pm Location: [address] Dayroom    AAA/T Program Assumption of Risk Form signed by Patient/ or Parent Legal Guardian yes  Patient is free of allergies or sever asthma yes  Patient reports no fear of animals yes  Patient reports no history of cruelty to animals yes  Patient understands his/her participation is voluntary yes  Behavioral Response: Did not attend  Canden Cieslinski L Caira Poche, LRT/CTRS         Jenayah Antu L 03/10/2015 3:00 PM 

## 2015-03-10 NOTE — [facility] Group Notes (Signed)
[facility] LCSW Group Therapy 03/10/2015 1:15 PM Type of Therapy: Group Therapy Participation Level: Active  Participation Quality: Attentive, Sharing and Supportive  Affect: Appropriate  Cognitive: Alert and Oriented  Insight: Developing/Improving and Engaged  Engagement in Therapy: Developing/Improving and Engaged  Modes of Intervention: Activity, Clarification, Confrontation, Discussion, Education, Exploration, Limit-setting, Orientation, Problem-solving, Rapport Building, Dance movement psychotherapisteality Testing, Socialization and Support  Summary of Progress/Problems: Patient was attentive and engaged with speaker from [employer]. Patient was attentive to speaker while they shared their story of dealing with mental health and overcoming it. Patient expressed interest in their programs and services and received information on their agency. Patient processed ways they can relate to the speaker.   Samuella BruinKristin Kanchan Gal, MSW, [employer]CS[employer] Clinical Social Worker [facility] [phone]

## 2015-03-10 NOTE — Progress Notes (Signed)
[facility] MD Progress Note  03/10/2015 2:49 PM Shane Olson  MRN:  [id number] Subjective:  I'm taking the medication.  I'm sleeping better.  Objective: Patient seen chart reviewed.  Patient is taking medication without any side effects.  She continues to have a lot of guilt about his depression and substance use.  He talk to his wife and his family member yesterday but did not provide details.  He admitted that he has been depressed for past 2 months since father died.  Patient continues to have poor eye contact, minimizes his symptoms and circumstances that led hospitalization.  However he is taking his medication.  He is going to the groups with limited participation.  He is not aggressive or agitated.  He appears withdrawn, isolated.  He denies any side effects of medication.  Principal Problem: Depressive disorder Diagnosis:   Patient Active Problem List   Diagnosis Date Noted  . Depressive disorder [F32.9] 03/09/2015  . Cocaine use disorder, moderate, dependence ([place]) [F14.20] 03/09/2015  . 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 Severe single current episode of major depressive disorder, without psychotic features ([place]) [F32.2]    Total Time spent with patient: 30 minutes  Past Psychiatric History: See history and physical  Past Medical History: History reviewed. No pertinent past medical history. History reviewed. No pertinent past surgical history. Family History: History reviewed. No pertinent family history. Family Psychiatric  History: See history and physical Social History:  History  Alcohol Use  . Yes    Comment: Yesterday last drink. Once every three months.      History  Drug Use No    Social History   Social History  . Marital Status: Married    Spouse Name: N/A  . Number of Children: N/A  . Years of Education: N/A   Social History Main Topics  . Smoking status: Never Smoker   . Smokeless tobacco: None  . Alcohol Use: Yes     Comment:  Yesterday last drink. Once every three months.   . Drug Use: No  . Sexual Activity: Not Asked   Other Topics Concern  . None   Social History Narrative   Additional Social History:                         Sleep: Fair  Appetite:  Fair  Current Medications: Current Facility-Administered Medications  Medication Dose Route Frequency Provider Last Rate Last Dose  . acetaminophen (TYLENOL) tablet 650 mg  650 mg Oral Q6H PRN Harriet Butte, NP      . alum & mag hydroxide-simeth (MAALOX/MYLANTA) 200-200-20 MG/5ML suspension 30 mL  30 mL Oral Q4H PRN Harriet Butte, NP      . FLUoxetine (PROZAC) capsule 10 mg  10 mg Oral Daily Kathlee Nations, MD   10 mg at 03/10/15 0840  . hydrOXYzine (ATARAX/VISTARIL) tablet 25 mg  25 mg Oral TID PRN Harriet Butte, NP      . magnesium hydroxide (MILK OF MAGNESIA) suspension 30 mL  30 mL Oral Daily PRN Harriet Butte, NP      . traZODone (DESYREL) tablet 50 mg  50 mg Oral QHS PRN Harriet Butte, NP        Lab Results:  Results for orders placed or performed during the hospital encounter of 03/08/15 (from the past 48 hour(s))  Comprehensive metabolic panel     Status: Abnormal   Collection Time: 03/08/15  7:42 PM  Result  Value Ref Range   Sodium 143 135 - 145 mmol/L   Potassium 3.4 (L) 3.5 - 5.1 mmol/L   Chloride 104 101 - 111 mmol/L   CO2 29 22 - 32 mmol/L   Glucose, Bld 115 (H) 65 - 99 mg/dL   BUN 12 6 - 20 mg/dL   Creatinine, Ser 0.94 0.61 - 1.24 mg/dL   Calcium 9.5 8.9 - 10.3 mg/dL   Total Protein 8.0 6.5 - 8.1 g/dL   Albumin 4.8 3.5 - 5.0 g/dL   AST 24 15 - 41 U/L   ALT 17 17 - 63 U/L   Alkaline Phosphatase 65 38 - 126 U/L   Total Bilirubin 1.1 0.3 - 1.2 mg/dL   GFR calc non Af Amer >60 >60 mL/min   GFR calc Af Amer >60 >60 mL/min    Comment: (NOTE) The eGFR has been calculated using the CKD EPI equation. This calculation has not been validated in all clinical situations. eGFR's persistently <60 mL/min signify possible  Chronic Kidney Disease.    Anion gap 10 5 - 15  Ethanol (ETOH)     Status: None   Collection Time: 03/08/15  7:42 PM  Result Value Ref Range   Alcohol, Ethyl (B) <5 <5 mg/dL    Comment:        LOWEST DETECTABLE LIMIT FOR SERUM ALCOHOL IS 5 mg/dL FOR MEDICAL PURPOSES ONLY   Salicylate level     Status: None   Collection Time: 03/08/15  7:42 PM  Result Value Ref Range   Salicylate Lvl <2.2 2.8 - 30.0 mg/dL  Acetaminophen level     Status: Abnormal   Collection Time: 03/08/15  7:42 PM  Result Value Ref Range   Acetaminophen (Tylenol), Serum <10 (L) 10 - 30 ug/mL    Comment:        THERAPEUTIC CONCENTRATIONS VARY SIGNIFICANTLY. A RANGE OF 10-30 ug/mL MAY BE AN EFFECTIVE CONCENTRATION FOR MANY PATIENTS. HOWEVER, SOME ARE BEST TREATED AT CONCENTRATIONS OUTSIDE THIS RANGE. ACETAMINOPHEN CONCENTRATIONS >150 ug/mL AT 4 HOURS AFTER INGESTION AND >50 ug/mL AT 12 HOURS AFTER INGESTION ARE OFTEN ASSOCIATED WITH TOXIC REACTIONS.   CBC     Status: None   Collection Time: 03/08/15  7:42 PM  Result Value Ref Range   WBC 9.8 4.0 - 10.5 K/uL   RBC 4.78 4.22 - 5.81 MIL/uL   Hemoglobin 14.9 13.0 - 17.0 g/dL   HCT 42.9 39.0 - 52.0 %   MCV 89.7 78.0 - 100.0 fL   MCH 31.2 26.0 - 34.0 pg   MCHC 34.7 30.0 - 36.0 g/dL   RDW 12.4 11.5 - 15.5 %   Platelets 166 150 - 400 K/uL  Urine rapid drug screen (hosp performed) (Not at [facility])     Status: Abnormal   Collection Time: 03/08/15  7:47 PM  Result Value Ref Range   Opiates NONE DETECTED NONE DETECTED   Cocaine POSITIVE (A) NONE DETECTED   Benzodiazepines NONE DETECTED NONE DETECTED   Amphetamines POSITIVE (A) NONE DETECTED   Tetrahydrocannabinol NONE DETECTED NONE DETECTED   Barbiturates NONE DETECTED NONE DETECTED    Comment:        DRUG SCREEN FOR MEDICAL PURPOSES ONLY.  IF CONFIRMATION IS NEEDED FOR ANY PURPOSE, NOTIFY LAB WITHIN 5 DAYS.        LOWEST DETECTABLE LIMITS FOR URINE DRUG SCREEN Drug Class       Cutoff  (ng/mL) Amphetamine      1000 Barbiturate      200 Benzodiazepine  729 Tricyclics       021 Opiates          300 Cocaine          300 THC              50     Physical Findings: AIMS: Facial and Oral Movements Muscles of Facial Expression: None, normal Lips and Perioral Area: None, normal Jaw: None, normal Tongue: None, normal,Extremity Movements Upper (arms, wrists, hands, fingers): None, normal Lower (legs, knees, ankles, toes): None, normal, Trunk Movements Neck, shoulders, hips: None, normal, Overall Severity Severity of abnormal movements (highest score from questions above): None, normal Incapacitation due to abnormal movements: None, normal Patient's awareness of abnormal movements (rate only patient's report): No Awareness, Dental Status Current problems with teeth and/or dentures?: No Does patient usually wear dentures?: No  CIWA:  CIWA-Ar Total: 1 COWS:  COWS Total Score: 1  Musculoskeletal: Strength & Muscle Tone: within normal limits Gait & Station: normal Patient leans: N/A  Psychiatric Specialty Exam: Review of Systems  Skin: Negative for itching and rash.  Neurological: Negative for dizziness, tingling, tremors and headaches.  Psychiatric/Behavioral: Positive for depression. The patient is nervous/anxious and has insomnia.     Blood pressure 116/69, pulse 76, temperature 98.4 F (36.9 C), temperature source Oral, resp. rate 16, height _0  (1.778 m), weight 73.029 kg (161 lb).Body mass index is 23.1 kg/(m^2).  General Appearance: Fairly Groomed and Guarded  Engineer, water::  Fair  Speech:  Slow  Volume:  Decreased  Mood:  Anxious, Depressed and Dysphoric  Affect:  Constricted and Depressed  Thought Process:  Logical  Orientation:  Full (Time, Place, and Person)  Thought Content:  Rumination  Suicidal Thoughts:  No  Homicidal Thoughts:  No  Memory:  Immediate;   Fair Recent;   Fair Remote;   Fair  Judgement:  Fair  Insight:  Fair  Psychomotor  Activity:  Decreased  Concentration:  Fair  Recall:  [employer] of Knowledge:Fair  Language: Fair  Akathisia:  No  Handed:  Right  AIMS (if indicated):     Assets:  Communication Skills Desire for Improvement Housing Social Support  ADL's:  Intact  Cognition: WNL  Sleep:  Number of Hours: 6.75   Treatment Plan Summary: Daily contact with patient to assess and evaluate symptoms and progress in treatment and Medication management  Continue Prozac 10 mg daily and Vistaril 25 mg as needed for anxiety.  Continue trazodone at bedtime for sleep.  Encouraged to participate in group milieu therapy.  Increase collateral information from his family.  Social worker to get more collateral information from family members prior to appropriate discharge planning.  Rolin Schult T. 03/10/2015, 2:49 PM

## 2015-03-10 NOTE — Progress Notes (Addendum)
Patient's sister  Ronnald Nianlizabeth Gohr phone [phone] called and wanted to know when patient was to be discharged.

## 2015-03-10 NOTE — Plan of Care (Signed)
Problem: Alteration in thought process Goal: STG-Patient does not respond to command hallucinations Outcome: Progressing Pt denies AVH and does not appear to be responding to internal stimuli     

## 2015-03-11 MED ORDER — HYDROXYZINE HCL 25 MG PO TABS: 25.0000 mg | ORAL_TABLET | Freq: Three times a day (TID) | ORAL | Status: AC | PRN

## 2015-03-11 MED ORDER — FLUOXETINE HCL 10 MG PO CAPS: 10.0000 mg | ORAL_CAPSULE | Freq: Every day | ORAL | Status: AC

## 2015-03-11 MED ORDER — TRAZODONE HCL 50 MG PO TABS: 50.0000 mg | ORAL_TABLET | Freq: Every evening | ORAL | Status: AC | PRN

## 2015-03-11 NOTE — Progress Notes (Signed)
Discharge Note:  Patient discharged home with family.  Patient denied SI and HI.  Denied A/V hallucinations.  Denied pain.  Suicide prevention information given and discussed with patient who stated he understood and had no questions.  Patient stated he received all his belongings, clothing, toiletries, misc items, prescriptions, belt, wallet with cards, key, sneakers, hoodie, etc.  Patient stated [facility].

## 2015-03-11 NOTE — Discharge Summary (Signed)
Physician Discharge Summary Note  Patient:  Shane Olson is an 24 y.o., male MRN:  [id number] DOB:  03-16-1991 Patient phone:  [phone] (home)  Patient address:   [address] [place] [address],  Total Time spent with patient: 30 minutes  Date of Admission:  03/08/2015 Date of Discharge: 03/11/2015  Reason for Admission:  Depression  Principal Problem: Depressive disorder Discharge Diagnoses: Patient Active Problem List   Diagnosis Date Noted  . Depressive disorder [F32.9] 03/09/2015  . Cocaine use disorder, moderate, dependence (HCC) [F14.20] 03/09/2015  . 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 Severe single current episode of major depressive disorder, without psychotic features (HCC) [F32.2]     Past Psychiatric History:  Depressive disorder  Past Medical History: History reviewed. No pertinent past medical history. History reviewed. No pertinent past surgical history. Family History: History reviewed. No pertinent family history. Family Psychiatric  History:  Denies Social History:  History  Alcohol Use  . Yes    Comment: Yesterday last drink. Once every three months.      History  Drug Use No    Social History   Social History  . Marital Status: Married    Spouse Name: N/A  . Number of Children: N/A  . Years of Education: N/A   Social History Main Topics  . Smoking status: Never Smoker   . Smokeless tobacco: None  . Alcohol Use: Yes     Comment: Yesterday last drink. Once every three months.   . Drug Use: No  . Sexual Activity: Not Asked   Other Topics Concern  . None   Social History Narrative    Hospital Course:  Shane Olson was admitted for Depressive disorder and crisis management.  He was treated with the following medications as listed below.  Shane Olson was discharged with current medication and was instructed on how to take medications as prescribed; (details listed below under Medication List).   Medical problems were identified and treated as needed.  Home medications were restarted as appropriate.  Improvement was monitored by observation and Shane Olson daily report of symptom reduction.  Emotional and mental status was monitored by daily self-inventory reports completed by Shane Olson and clinical staff.         Shane Olson was evaluated by the treatment team for stability and plans for continued recovery upon discharge.  Shane Olson motivation was an integral factor for scheduling further treatment.  Employment, transportation, bed availability, health status, family support, and any pending legal issues were also considered during his hospital stay.  He was offered further treatment options upon discharge including but not limited to Residential, Intensive Outpatient, and Outpatient treatment.  Shane Olson will follow up with the services as listed below under Follow Up Information.     Upon completion of this admission the Shane Olson was both mentally and medically stable for discharge denying suicidal/homicidal ideation, auditory/visual/tactile hallucinations, delusional thoughts and paranoia.     Physical Findings: AIMS: Facial and Oral Movements Muscles of Facial Expression: None, normal Lips and Perioral Area: None, normal Jaw: None, normal Tongue: None, normal,Extremity Movements Upper (arms, wrists, hands, fingers): None, normal Lower (legs, knees, ankles, toes): None, normal, Trunk Movements Neck, shoulders, hips: None, normal, Overall Severity Severity of abnormal movements (highest score from questions above): None, normal Incapacitation due to abnormal movements: None, normal Patient's awareness of abnormal movements (rate only patient's report): No Awareness, Dental Status Current problems with teeth and/or dentures?: No Does patient usually wear dentures?:  No  CIWA:  CIWA-Ar Total: 1 COWS:  COWS Total Score:  1  Musculoskeletal: Strength & Muscle Tone: within normal limits Gait & Station: normal Patient leans: N/A  Psychiatric Specialty Exam:  SEE MD SRA Review of Systems  All other systems reviewed and are negative.   Blood pressure 106/69, pulse 79, temperature 98 F (36.7 C), temperature source Oral, resp. rate 12, height 5\' 10"  (1.778 m), weight 73.029 kg (161 lb).Body mass index is 23.1 kg/(m^2).  Has this patient used any form of tobacco in the last 30 days? (Cigarettes, Smokeless Tobacco, Cigars, and/or Pipes) Yes, N/A  Metabolic Disorder Labs:  No results found for: HGBA1C, MPG No results found for: PROLACTIN No results found for: CHOL, TRIG, HDL, CHOLHDL, VLDL, LDLCALC  See Psychiatric Specialty Exam and Suicide Risk Assessment completed by Attending Physician prior to discharge.  Discharge destination:  Home  Is patient on multiple antipsychotic therapies at discharge:  No   Has Patient had three or more failed trials of antipsychotic monotherapy by history:  No  Recommended Plan for Multiple Antipsychotic Therapies: NA    Medication List    STOP taking these medications        acetaminophen 500 MG tablet  Commonly known as:  TYLENOL     oxymetazoline 0.05 % nasal spray  Commonly known as:  AFRIN     REDNESS RELIEVER EYE DROPS OP     THERAFLU SEVERE COLD 20-10-500 MG Pack  Generic drug:  DM-Phenylephrine-Acetaminophen     VICKS DAYQUIL/NYQUIL CLD & FLU (LIQUID) Misc  Generic drug:  PE-DM-APAP & Doxylamin-DM-APAP      TAKE these medications      Indication   FLUoxetine 10 MG capsule  Commonly known as:  PROZAC  Take 1 capsule (10 mg total) by mouth daily.   Indication:  Depression     hydrOXYzine 25 MG tablet  Commonly known as:  ATARAX/VISTARIL  Take 1 tablet (25 mg total) by mouth 3 (three) times daily as needed for anxiety.   Indication:  Anxiety Neurosis     traZODone 50 MG tablet  Commonly known as:  DESYREL  Take 1 tablet (50 mg total) by  mouth at bedtime as needed for sleep (May repeat x1).   Indication:  Major Depressive Disorder           Follow-up Information    [facility]   Specialty:  Behavioral Health   Why:  Walk-in clinic for assessment for medication management services Monday through Friday between 8am to 3pm. Please arrive early to be seen as quickly as possible.    Contact information:   [address] [place] [address] [phone]       [facility].   Why:  Grief counseling appointment with Judd LienAndrea Summers on Tuesday Dec. 27th at 4pm. Call office if you need to reschedule.    Contact information:   [address],  [place], [place] [address]  [phone]       Follow-up recommendations:  Activity:  as tol Diet:  as tol  Comments:  1.  Take all your medications as prescribed.              2.  Report any adverse side effects to outpatient provider.                       3.  Patient instructed to not use alcohol or illegal drugs  while on prescription medicines.            4.  In the event of worsening symptoms, instructed patient to call 911, the crisis hotline or go to nearest emergency room for evaluation of symptoms.  Signed: Velna Hatchet May Agustin AGNP-BC 03/11/2015, 10:07 AM   Patient seen face to face for psychiatric evaluation. Chart reviewed and finding discussed with Physician extender. Agreed with disposition and treatment plan.   Kathryne Sharper, MD

## 2015-03-11 NOTE — Progress Notes (Signed)
  [facility] Management Discharge Plan :  Will you be returning to the same living situation after discharge:  Yes,  patient plans to return home with family At discharge, do you have transportation home?: Yes,  patient's sister will pick up Do you have the ability to pay for your medications: Yes,  patient will be provided with prescriptions at discharge  Release of information consent forms completed and in the chart;  Patient's signature needed at discharge.  Patient to Follow up at: Follow-up Information    [facility]   Specialty:  Behavioral Health   Why:  Walk-in clinic for assessment for medication management services Monday through Friday between 8am to 3pm. Please arrive early to be seen as quickly as possible.    Contact information:   [address] [place] [address] [phone]       [facility].   Why:  Grief counseling appointment with Judd LienAndrea Summers on Tuesday Dec. 27th at 4pm. Call office if you need to reschedule.    Contact information:   [address],  [place], [place] [address]  [phone]       Next level of care provider has access to [facility] Link:no  Safety Planning and Suicide Prevention discussed: Yes,  with patient's sister and patient     Has patient been referred to the Quitline?: N/A patient is not a smoker  Patient has been referred for addiction treatment: N/A  Lashunda Greis, [place] Carbo[place]ristin L 03/11/2015, 10:11 AM

## 2015-03-11 NOTE — BHH Group Notes (Signed)
   [facility] Note  03/11/2015  8:45 AM   Participation Quality: Alert, Appropriate and Oriented  Mood/Affect: Appropriate  Depression Rating: 0  Anxiety Rating: 0  Thoughts of Suicide: Pt denies SI/HI  Will you contract for safety? Yes  Current AVH: Pt denies  Plan for Discharge/Comments: Pt attended discharge planning group and actively participated in group. CSW provided pt with today's workbook. Patient reports feeling well this morning and states that he slept well last night. He expresses feeling stable for discharge today. He is agreeable to follow up with outpatient services at discharge.   Transportation Means: Pt reports access to transportation by sister  Supports: No supports mentioned at this time  Samuella BruinKristin Jaylean Buenaventura, MSW, [employer]CSWA Clinical Social Worker [facility] [phone]

## 2015-03-11 NOTE — BHH Suicide Risk Assessment (Signed)
[facility] Discharge Suicide Risk Assessment   Demographic Factors:  Adolescent or young adult  Total Time spent with patient: 45 minutes  Musculoskeletal: Strength & Muscle Tone: within normal limits Gait & Station: normal Patient leans: N/A  Psychiatric Specialty Exam: Physical Exam  ROS  Blood pressure 106/69, pulse 79, temperature 98 F (36.7 C), temperature source Oral, resp. rate 12, height  (1.778 m), weight 73.029 kg (161 lb).Body mass index is 23.1 kg/(m^2).  General Appearance: Casual  Eye Contact::  Good  Speech:  Normal Rate409  Volume:  Normal  Mood:  Euthymic  Affect:  Appropriate  Thought Process:  Coherent  Orientation:  Full (Time, Place, and Person)  Thought Content:  WDL  Suicidal Thoughts:  No  Homicidal Thoughts:  No  Memory:  Immediate;   Good Recent;   Good Remote;   Good  Judgement:  Good  Insight:  Good  Psychomotor Activity:  Normal  Concentration:  Good  Recall:  Good  Fund of Knowledge:Good  Language: Good  Akathisia:  No  Handed:  Right  AIMS (if indicated):     Assets:  Communication Skills Desire for Improvement Financial Resources/Insurance Housing Intimacy Physical Health Social Support Talents/Skills Transportation  Sleep:  Number of Hours: 7  Cognition: WNL  ADL's:  Intact      Has this patient used any form of tobacco in the last 30 days? (Cigarettes, Smokeless Tobacco, Cigars, and/or Pipes) No  Mental Status Per Nursing Assessment::   On Admission:  Suicidal ideation indicated by others, Self-harm behaviors  Current Mental Status by Physician: see above  Loss Factors: NA  Historical Factors: Impulsivity  Risk Reduction Factors:   Responsible for children under 28 years of age, Sense of responsibility to family, Religious beliefs about death, Employed, Living with another person, especially a relative, Positive social support, Positive therapeutic relationship and Positive coping skills or problem solving  skills  Continued Clinical Symptoms:  Alcohol/Substance Abuse/Dependencies  Cognitive Features That Contribute To Risk:  None    Suicide Risk:  Minimal: No identifiable suicidal ideation.  Patients presenting with no risk factors but with morbid ruminations; may be classified as minimal risk based on the severity of the depressive symptoms  Principal Problem: Depressive disorder Discharge Diagnoses:  Patient Active Problem List   Diagnosis Date Noted  . Depressive disorder [F32.9] 03/09/2015  . Cocaine use disorder, moderate, dependence (HCC) [F14.20] 03/09/2015  . 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 Severe single current episode of major depressive disorder, without psychotic features (HCC) [F32.2]     Follow-up Information    [facility]   Specialty:  Behavioral Health   Why:  Walk-in clinic for assessment for medication management services Monday through Friday between 8am to 3pm. Please arrive early to be seen as quickly as possible.    Contact information:   [address] [place] [address] [phone]       [facility] On 03/17/2015.   Why:  Grief counseling appointment with Judd Lien on Tuesday Dec. 27th at 4pm. Call office if you need to reschedule.    Contact information:   [address]  [place], [place] [address]  [phone]       Plan Of Care/Follow-up recommendations:  Activity:  As tolerated Diet:  Unchanged from the past Tests:  Patient will follow up with counseling for counseling and we will continue Prozac to help his depression.  Is patient on multiple antipsychotic  therapies at discharge:  No   Has Patient had three or more failed trials of antipsychotic monotherapy by history:  No  Recommended Plan for Multiple Antipsychotic Therapies: NA    Shaniah Baltes T. 03/11/2015, 10:40 AM

## 2015-03-11 NOTE — Progress Notes (Signed)
Recreation Therapy Notes  Date: 12.21.2016 Time: 9:30am Location: [address] Group Room   Group Topic: Stress Management  Goal Area(s) Addresses:  Patient will actively participate in stress management techniques presented during session.   Behavioral Response: Appropriate, Engaged   Intervention: Stress management techniques  Activity :  Deep Breathing and Progressive Muscle Relaxation. LRT provided instruction and demonstration on practice of Progressive Muscle Relaxation. Technique was coupled with deep breathing.   Education:  Stress Management, Discharge Planning.   Education Outcome: Acknowledges education  Clinical Observations/Feedback: Patient actively engaged in technique introduced, expressed no concerns and demonstrated ability to practice independently post d/c.   Nell Schrack L Malik Ruffino, LRT/CTRS        Denicia Pagliarulo L 03/11/2015 11:51 AM 

## 2015-03-11 NOTE — Progress Notes (Signed)
D:  Patient's self inventory sheet, patient has been sleeping good, no sleep medication given.  Good appetite, normal energy level, good concentration.  Denied depression, hopeless and anxiety.  Denied withdrawals.  Denied SI.  Denied physical problems.  Denied pain.  Goal is to go back to work and have his normal life.  Plans to make the best of things.  Does have discharge plans. A:  Medications administered per MD orders.  Emotional support and encouragement given patient. R:  Denied SI and HI, contracts for safety.  Denied A/V hallucinations.  Safety maintained with 15 minute checks.

## 2015-03-11 NOTE — BHH Suicide Risk Assessment (Signed)
[facility] INPATIENT:  Family/Significant Other Suicide Prevention Education  Suicide Prevention Education:  Education Completed; sister Ronnald Nianlizabeth West [phone],  (name of family member/significant other) has been identified by the patient as the family member/significant other with whom the patient will be residing, and identified as the person(s) who will aid the patient in the event of a mental health crisis (suicidal ideations/suicide attempt).  With written consent from the patient, the family member/significant other has been provided the following suicide prevention education, prior to the and/or following the discharge of the patient.  The suicide prevention education provided includes the following:  Suicide risk factors  Suicide prevention and interventions  National Suicide Hotline telephone number  [facility] assessment telephone number  [facility] Emergency Assistance 911  [facility] and/or Residential Mobile Crisis Unit telephone number  Request made of family/significant other to:  Remove weapons (e.g., guns, rifles, knives), all items previously/currently identified as safety concern.    Remove drugs/medications (over-the-counter, prescriptions, illicit drugs), all items previously/currently identified as a safety concern.  The family member/significant other verbalizes understanding of the suicide prevention education information provided.  The family member/significant other agrees to remove the items of safety concern listed above.  Derricka Mertz, [place]ristin L 03/11/2015, 10:07 AM

## 2017-11-04 ENCOUNTER — Emergency Department (HOSPITAL_COMMUNITY)
Admission: EM | Admit: 2017-11-04 | Discharge: 2017-11-04 | Disposition: A | Discharge status: Home / Self Care | Payer: Self-pay | Attending: Emergency Medicine | Admitting: Emergency Medicine

## 2017-11-04 ENCOUNTER — Encounter (HOSPITAL_COMMUNITY): Payer: Self-pay | Admitting: Emergency Medicine

## 2017-11-04 ENCOUNTER — Other Ambulatory Visit: Payer: Self-pay

## 2017-11-04 ENCOUNTER — Emergency Department (HOSPITAL_COMMUNITY): Payer: Self-pay

## 2017-11-04 DIAGNOSIS — W25XXXA Contact with sharp glass, initial encounter: Secondary | ICD-10-CM | POA: Insufficient documentation

## 2017-11-04 DIAGNOSIS — Z79899 Other long term (current) drug therapy: Secondary | ICD-10-CM | POA: Insufficient documentation

## 2017-11-04 DIAGNOSIS — Y939 Activity, unspecified: Secondary | ICD-10-CM | POA: Insufficient documentation

## 2017-11-04 DIAGNOSIS — S61412A Laceration without foreign body of left hand, initial encounter: Secondary | ICD-10-CM | POA: Insufficient documentation

## 2017-11-04 DIAGNOSIS — Y929 Unspecified place or not applicable: Secondary | ICD-10-CM | POA: Insufficient documentation

## 2017-11-04 DIAGNOSIS — Y999 Unspecified external cause status: Secondary | ICD-10-CM | POA: Insufficient documentation

## 2017-11-04 MED ORDER — ONDANSETRON 4 MG PO TBDP
4.0000 mg | ORAL_TABLET | Freq: Once | ORAL | Status: AC
  Administered 2017-11-04: 4 mg via ORAL
  Filled 2017-11-04: qty 1

## 2017-11-04 MED ORDER — LIDOCAINE HCL (PF) 1 % IJ SOLN
5.0000 mL | Freq: Once | INTRAMUSCULAR | Status: AC
  Administered 2017-11-04: 5 mL
  Filled 2017-11-04: qty 5

## 2017-11-04 NOTE — ED Triage Notes (Signed)
Patient to ED with approximately 1.5-2 inch laceration across to dorsum of L hand after old broken window fell on it this morning. Bleeding currently controlled.

## 2017-11-04 NOTE — ED Notes (Signed)
Patient verbalized understanding of discharge instructions and teach-back r/e properly cleaning wound. He denies any further needs or questions at this time. VS stable. Patient ambulatory with steady gait.

## 2017-11-04 NOTE — Discharge Instructions (Addendum)
Return to the emergency department or see your doctor for suture removal in 10 days. Return sooner for recheck if there is any drainage from the wounds, increasing redness or pain.

## 2017-11-04 NOTE — ED Provider Notes (Signed)
[facility] Note   CSN: [id number] Arrival date & time: 11/04/17  1124     History   Chief Complaint Chief Complaint  Patient presents with  . Extremity Laceration    HPI Shane Olson is a 27 y.o. male.  HPI  History reviewed. No pertinent past medical history.  Patient Active Problem List   Diagnosis Date Noted  . Depressive disorder 03/09/2015  . Cocaine use disorder, moderate, dependence (HCC) 03/09/2015  . Amphetamine use disorder, moderate (HCC) 03/09/2015  . Severe single current episode of major depressive disorder, without psychotic features (HCC)     History reviewed. No pertinent surgical history.      Home Medications    Prior to Admission medications   Medication Sig Start Date End Date Taking? Authorizing Provider  FLUoxetine (PROZAC) 10 MG capsule Take 1 capsule (10 mg total) by mouth daily. 03/11/15   Adonis BrookAgustin, Sheila, NP  hydrOXYzine (ATARAX/VISTARIL) 25 MG tablet Take 1 tablet (25 mg total) by mouth 3 (three) times daily as needed for anxiety. 03/11/15   Adonis BrookAgustin, Sheila, NP  traZODone (DESYREL) 50 MG tablet Take 1 tablet (50 mg total) by mouth at bedtime as needed for sleep (May repeat x1). 03/11/15   Adonis BrookAgustin, Sheila, NP    Family History No family history on file.  Social History Social History   Tobacco Use  . Smoking status: Never Smoker  Substance Use Topics  . Alcohol use: Yes    Comment: Yesterday last drink. Once every three months.   . Drug use: No     Allergies   Patient has no known allergies.   Review of Systems Review of Systems  Musculoskeletal:       See HPI  Skin: Positive for wound.  Neurological: Negative for weakness and numbness.     Physical Exam Updated Vital Signs BP (!) 93/58 (BP Location: Right Arm)   Pulse (!) 58   Temp 98.2 F (36.8 C) (Oral)   Resp 16   Ht 5\' 11"  (1.803 m)   Wt 82.6 kg   SpO2 99%   BMI 25.38 kg/m   Physical Exam    Constitutional: He is oriented to person, place, and time. He appears well-developed and well-nourished.  Neck: Normal range of motion.  Pulmonary/Chest: Effort normal.  Musculoskeletal: Normal range of motion.  There is possible joint space invasion to 3rd MCP. No FB's visualized. FROM. No tendon deficits.    Neurological: He is alert and oriented to person, place, and time.  Skin: Skin is warm and dry.  Lacerations x 2 to dorsal left hand. First is 2 cm over 3rd MCP and 2nd is 2 cm over 4th MCP.   Psychiatric: He has a normal mood and affect.     ED Treatments / Results  Labs (all labs ordered are listed, but only abnormal results are displayed) Labs Reviewed - No data to display  EKG None  Radiology No results found.  Procedures .Marland Kitchen.Laceration Repair Date/Time: 11/04/2017 12:26 PM Performed by: Elpidio AnisUpstill, Tomy Khim, PA-C Authorized by: Elpidio AnisUpstill, Julia Kulzer, PA-C   Consent:    Consent obtained:  Verbal   Consent given by:  Patient Anesthesia (see MAR for exact dosages):    Anesthesia method:  Local infiltration   Local anesthetic:  Lidocaine 1% w/o epi Laceration details:    Location:  Hand   Hand location:  L hand, dorsum   Length (cm):  2 Repair type:    Repair type:  Simple Pre-procedure details:  Preparation:  Patient was prepped and draped in usual sterile fashion and imaging obtained to evaluate for foreign bodies Exploration:    Hemostasis achieved with:  Direct pressure   Wound exploration: wound explored through full range of motion and entire depth of wound probed and visualized     Wound extent: fascia violated     Wound extent: no foreign bodies/material noted     Contaminated: no   Treatment:    Area cleansed with:  Betadine and saline   Amount of cleaning:  Standard   Irrigation solution:  Sterile saline   Irrigation method:  Pressure wash Skin repair:    Repair method:  Sutures   Suture size:  4-0   Suture material:  Prolene   Suture technique:  Simple  interrupted   Number of sutures:  4 Approximation:    Approximation:  Close Post-procedure details:    Dressing:  Antibiotic ointment and non-adherent dressing Comments:     3rd MCP, dorsum .Marland Kitchen.Laceration Repair Date/Time: 11/04/2017 12:27 PM Performed by: Elpidio AnisUpstill, Angeldejesus Callaham, PA-C Authorized by: Elpidio AnisUpstill, Tayt Moyers, PA-C   Consent:    Consent obtained:  Verbal   Consent given by:  Patient Anesthesia (see MAR for exact dosages):    Anesthesia method:  Local infiltration   Local anesthetic:  Lidocaine 1% w/o epi Laceration details:    Location:  Hand   Hand location:  L hand, dorsum   Length (cm):  2 Repair type:    Repair type:  Simple Pre-procedure details:    Preparation:  Patient was prepped and draped in usual sterile fashion and imaging obtained to evaluate for foreign bodies Exploration:    Hemostasis achieved with:  Direct pressure   Wound extent: no fascia violation noted and no foreign bodies/material noted     Contaminated: no   Treatment:    Area cleansed with:  Betadine and saline   Amount of cleaning:  Standard   Irrigation solution:  Sterile saline   Irrigation method:  Pressure wash Skin repair:    Repair method:  Sutures   Suture size:  4-0   Suture material:  Prolene   Suture technique:  Simple interrupted   Number of sutures:  5 Approximation:    Approximation:  Close Post-procedure details:    Patient tolerance of procedure:  Tolerated well, no immediate complications Comments:     4th MCP   (including critical care time)  Medications Ordered in ED Medications  lidocaine (PF) (XYLOCAINE) 1 % injection 5 mL (5 mLs Infiltration Given by Other 11/04/17 1159)  ondansetron (ZOFRAN-ODT) disintegrating tablet 4 mg (4 mg Oral Given 11/04/17 1217)     Initial Impression / Assessment and Plan / ED Course  I have reviewed the triage vital signs and the nursing notes.  Pertinent labs & imaging results that were available during my care of the patient were reviewed  by me and considered in my medical decision making (see chart for details).     Patient presents with lac x 2 dorsal left hand from broken pane of glass. Lac to 3rd MCP appears to possibly invade joint space. No functional deficit.   Imaging ordered to evaluate 3rd MCP joint space, r/o FB.   Imaging negative for FB or joint deformity.   Final Clinical Impressions(s) / ED Diagnoses   Final diagnoses:  None   1. Lacerations to hand, left (2)  ED Discharge Orders    None       Elpidio AnisUpstill, Suzana Sohail, PA-C 11/04/17 1304  Derwood Kaplan, MD 11/04/17 [phone]

## 2019-04-15 ENCOUNTER — Ambulatory Visit: Payer: Self-pay | Attending: Internal Medicine

## 2019-04-15 ENCOUNTER — Ambulatory Visit: Payer: Self-pay

## 2019-04-15 DIAGNOSIS — Z20822 Contact with and (suspected) exposure to covid-19: Secondary | ICD-10-CM | POA: Insufficient documentation

## 2019-04-16 LAB — NOVEL CORONAVIRUS, NAA: SARS-CoV-2, NAA: NOT DETECTED

## 2019-10-07 IMAGING — DX DG HAND COMPLETE 3+V*L*
1 series · 3 of 3 positions shown · non-contrast
Comparison: None.

CLINICAL DATA: Glass fell onto patient is an with minor laceration
to region of ring and pointer finger.

EXAM:
LEFT HAND - COMPLETE 3+ VIEW

[Series 1: hand · 0.14mm/px · 3 of 3 slices shown]
[im 1/3]
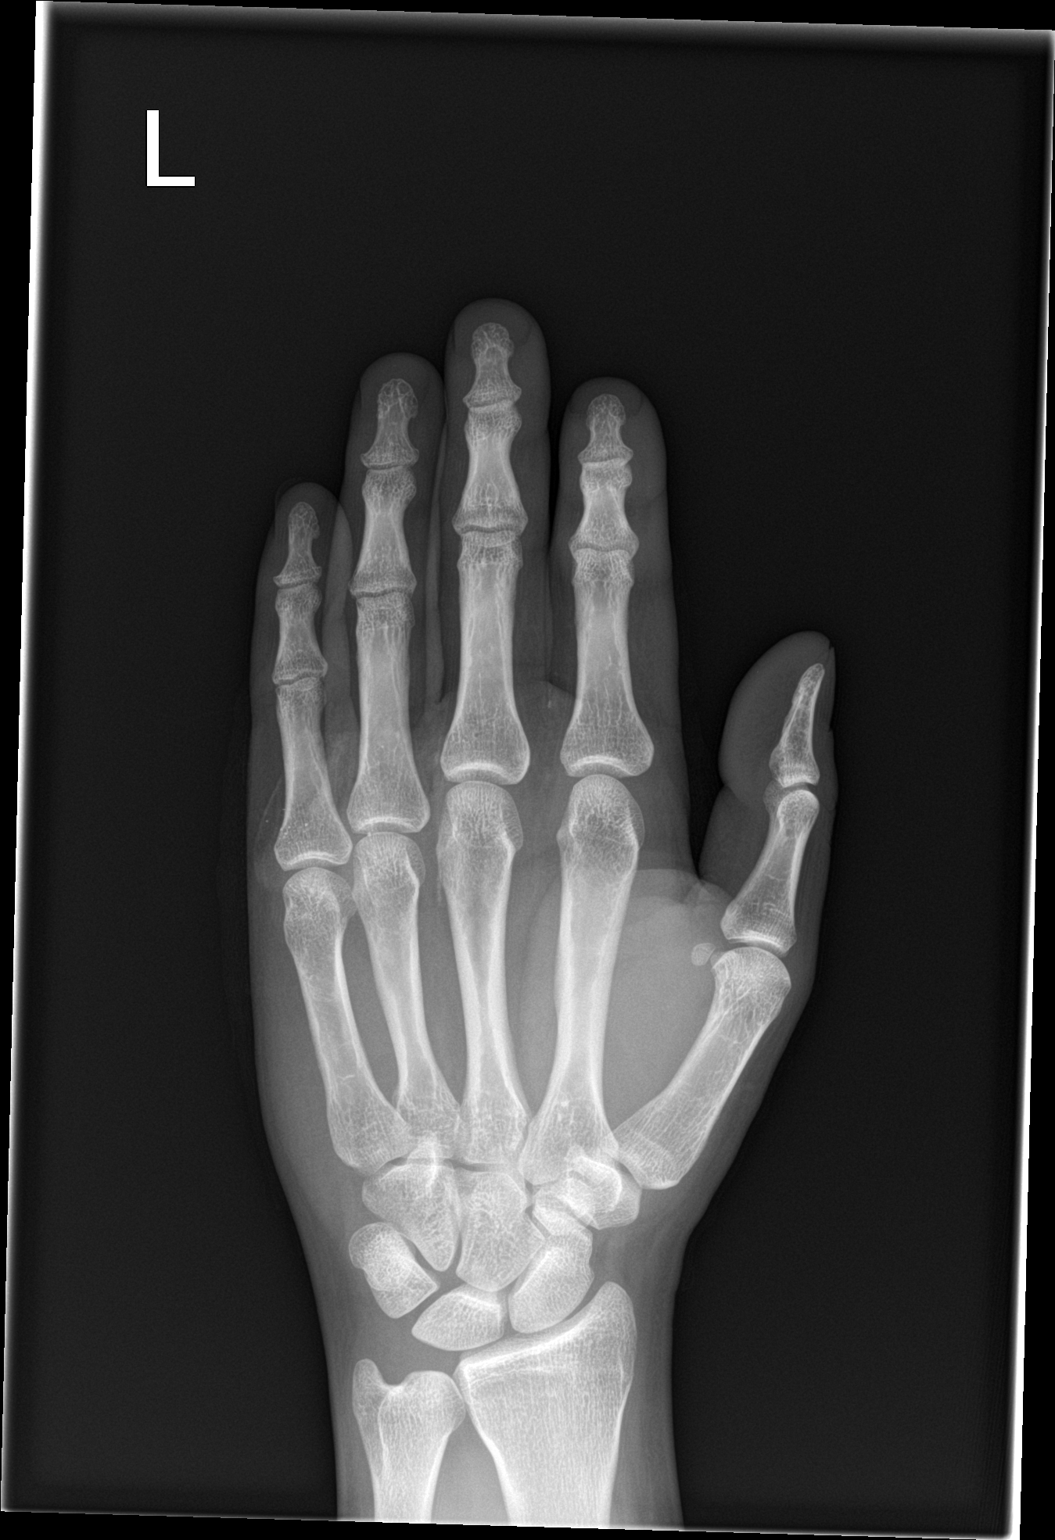
[im 2/3]
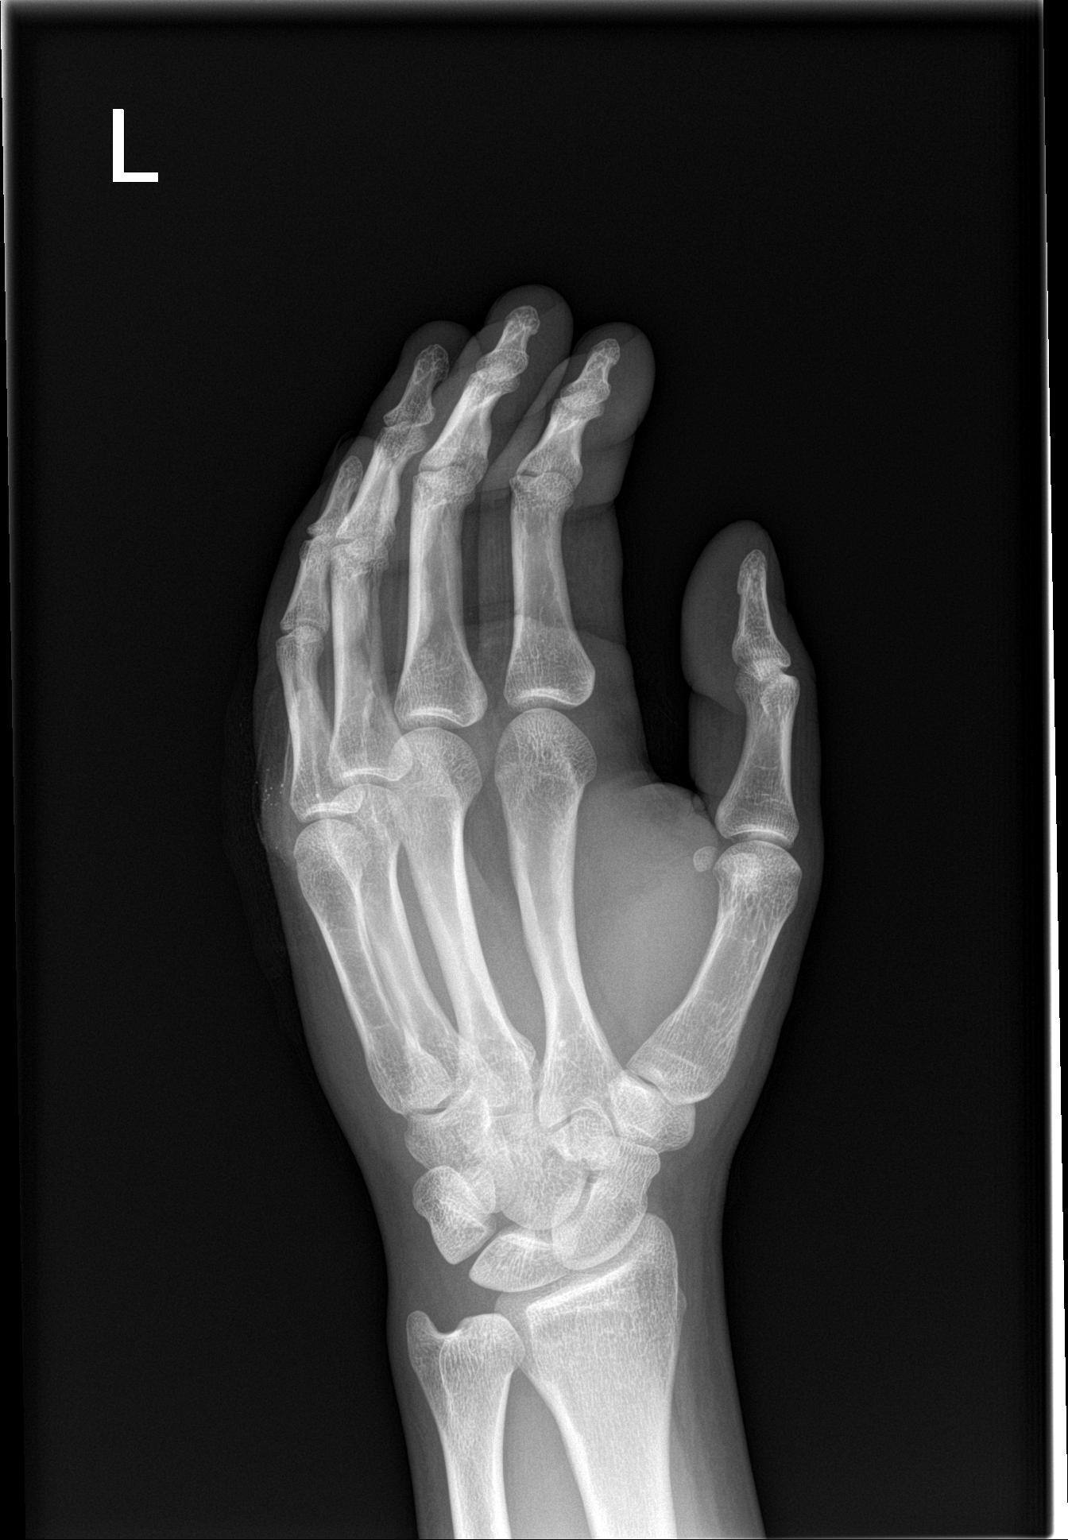
[im 3/3]
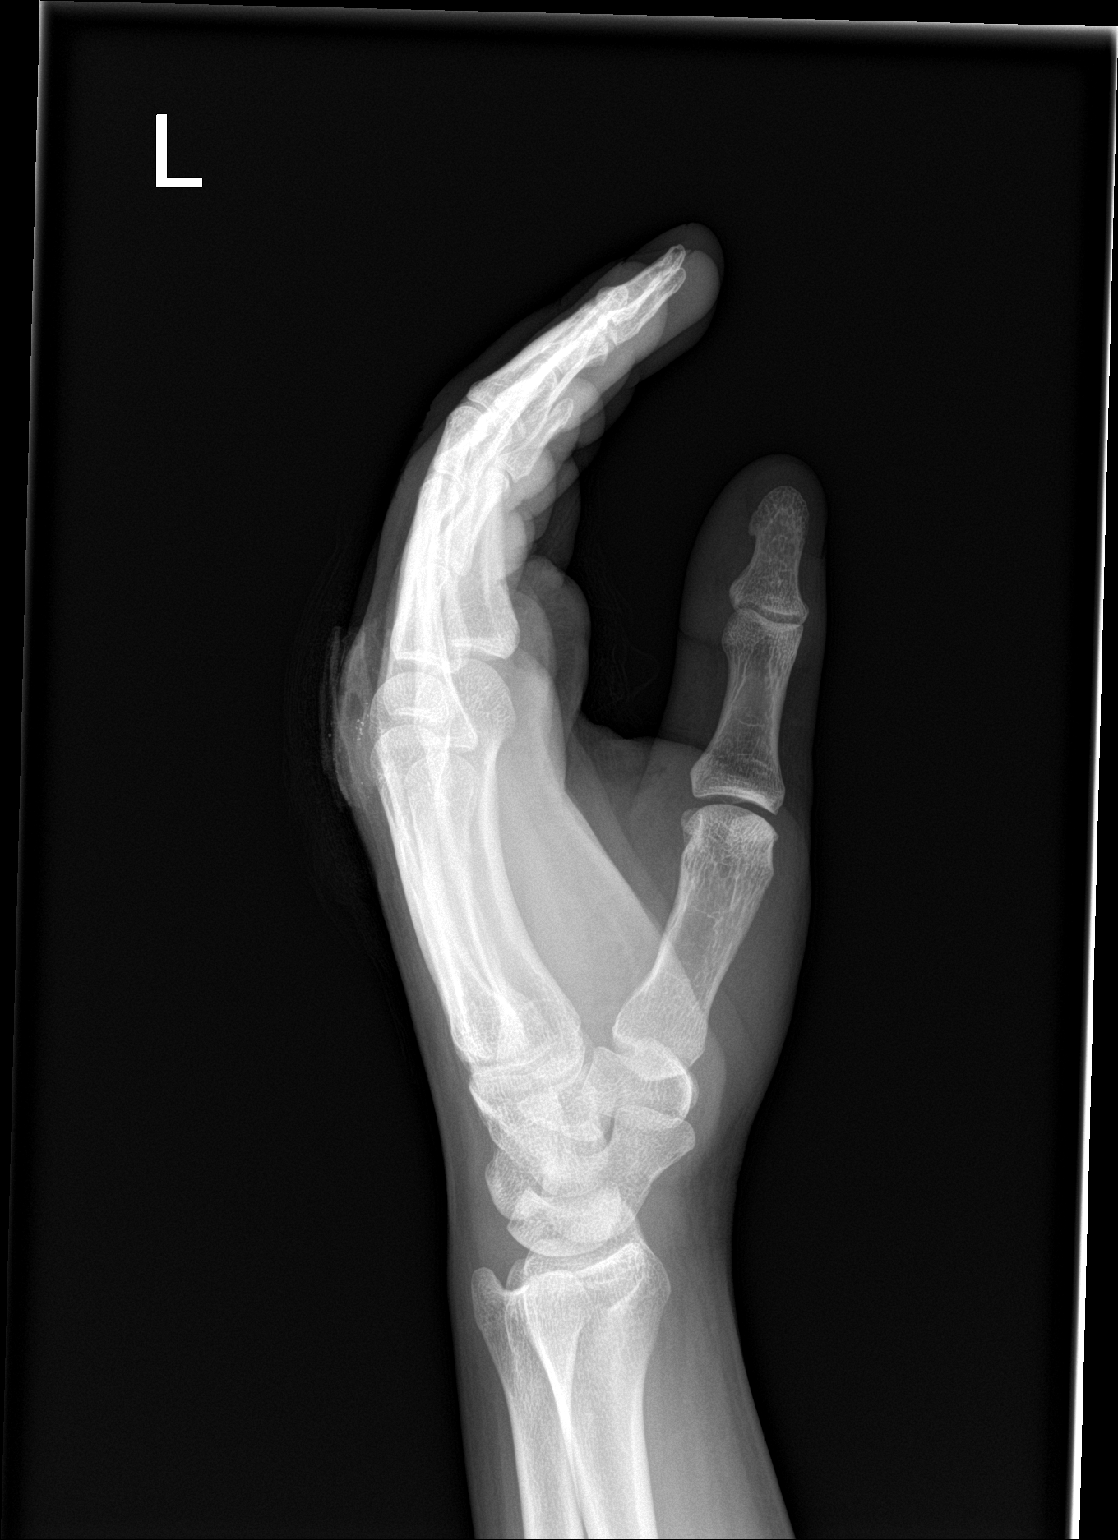

[3 of 3 positions shown; findings below may reference images not displayed]

FINDINGS: Small bandage overlying the fifth proximal phalanx. No evidence of
acute fracture or dislocation. Remainder the exam is unremarkable.
IMPRESSION: No acute findings.

## 2022-07-14 ENCOUNTER — Ambulatory Visit: Payer: Self-pay | Admitting: Physician Assistant

## 2022-12-23 ENCOUNTER — Ambulatory Visit (INDEPENDENT_AMBULATORY_CARE_PROVIDER_SITE_OTHER): Payer: Self-pay | Admitting: Physical Medicine and Rehabilitation

## 2022-12-23 ENCOUNTER — Encounter: Payer: Self-pay | Admitting: Physical Medicine and Rehabilitation

## 2022-12-23 ENCOUNTER — Other Ambulatory Visit (INDEPENDENT_AMBULATORY_CARE_PROVIDER_SITE_OTHER): Payer: Self-pay

## 2022-12-23 DIAGNOSIS — M545 Low back pain, unspecified: Secondary | ICD-10-CM

## 2022-12-23 DIAGNOSIS — M7918 Myalgia, other site: Secondary | ICD-10-CM

## 2022-12-23 DIAGNOSIS — G8929 Other chronic pain: Secondary | ICD-10-CM

## 2022-12-23 DIAGNOSIS — M4306 Spondylolysis, lumbar region: Secondary | ICD-10-CM

## 2022-12-23 MED ORDER — METHOCARBAMOL 500 MG PO TABS: 500.0000 mg | ORAL_TABLET | Freq: Three times a day (TID) | ORAL | 0 refills | Status: AC

## 2022-12-23 MED ORDER — MELOXICAM 15 MG PO TABS: 15.0000 mg | ORAL_TABLET | Freq: Every day | ORAL | 0 refills | Status: AC

## 2022-12-23 NOTE — Progress Notes (Unsigned)
Shane Olson - 32 y.o. male MRN [id number]  Date of birth: 1991-02-23  Office Visit Note: Visit Date: 12/23/2022 PCP: Patient, No Pcp Per Referred by: No ref. provider found  Subjective: Chief Complaint  Patient presents with   Lower Back - Pain   HPI: Shane Olson is a 32 y.o. male who comes in today as a self referral for evaluation of chronic, worsening and severe bilateral lower back pain, right greater than left. Pain ongoing for several years, worsened over the past 2 months. His pain increases with sitting. He describes pain as sharp and stabbing sensation, currently rates as 5 out of 10. States his pain is intermittent, some days he is pain free. Some relief of pain with home exercise regimen, rest and use of topical pain patches. No history of formal physical therapy. Lumbar x-rays from 2009 shows well preserved disc spacing, pars defects at L5, no spondylolisthesis. His pain has slowly increased over the last several months, states he had one other episode years ago where he "tweaked" his back that did resolve over time. 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 Patient denies focal weakness, numbness and tingling. No recent trauma or falls.   Of note, history of depression and substance abuse.     Oswestry Disability Index Score 12% 0 to 10 (20%)   minimal disability: The patient can cope with most living activities. Usually no treatment is indicated apart from advice on lifting sitting and exercise.  Review of Systems  Musculoskeletal:  Positive for back pain.  Neurological:  Negative for tingling, sensory change, focal weakness and weakness.  All other systems reviewed and are negative.  Otherwise per HPI.  Assessment & Plan: Visit Diagnoses:    ICD-10-CM   1. Chronic bilateral low back pain without sciatica  M54.50 XR Lumbar Spine Complete   G89.29 Ambulatory referral to Physical Therapy    2. Myofascial pain syndrome  M79.18 Ambulatory referral to Physical  Therapy    3. Pars defect of lumbar spine  M43.06 Ambulatory referral to Physical Therapy       Plan: Findings:  Chronic, worsening and severe bilateral lower back pain, right greater than left. No radicular symptoms down the legs. Patient continues to have severe pain despite good conservative therapies such as home exercise regimen, rest and use of medications. Patients clinical presentation and exam are consistent with lumbar myofascial strain. Tenderness noted upon palpation of bilateral paraspinal regions. I obtained lumbar x-rays in the office today that show We discussed treatment plan in detail, next step is to place order for short course of formal physical therapy. I do think he would benefit from manual treatments and core strengthening. I also prescribed Meloxicam and Robaxin. I explained to him that lumbar strain injuries can take 6-8 weeks to heal. Should his pain persist would consider obtaining lumbar MRI imaging and possible lumbar epidural steroid injection if warranted. I would like to see him back in 6 weeks for follow up. No red flag symptoms noted upon exam today.     Meds & Orders:  Meds ordered this encounter  Medications   meloxicam (MOBIC) 15 MG tablet    Sig: Take 1 tablet (15 mg total) by mouth daily.    Dispense:  30 tablet    Refill:  0   methocarbamol (ROBAXIN) 500 MG tablet    Sig: Take 1 tablet (500 mg total) by mouth 3 (three) times daily.    Dispense:  90 tablet    Refill:  0  Orders Placed This Encounter  Procedures   XR Lumbar Spine Complete   Ambulatory referral to Physical Therapy    Follow-up: Return for 6 week follow up.   Procedures: No procedures performed      Clinical History: No specialty comments available.   He reports that he has never smoked. He does not have any smokeless tobacco history on file. No results for input(s): "HGBA1C", "LABURIC" in the last 8760 hours.  Objective:  VS:  HT:    WT:   BMI:     BP:   HR: bpm   TEMP: ( )  RESP:  Physical Exam Vitals and nursing note reviewed.  HENT:     Head: Normocephalic and atraumatic.     Right Ear: External ear normal.     Left Ear: External ear normal.     Nose: Nose normal.     Mouth/Throat:     Mouth: Mucous membranes are moist.  Eyes:     Extraocular Movements: Extraocular movements intact.  Cardiovascular:     Rate and Rhythm: Normal rate.     Pulses: Normal pulses.  Pulmonary:     Effort: Pulmonary effort is normal.  Abdominal:     General: Abdomen is flat. There is no distension.  Musculoskeletal:        General: Tenderness present.     Cervical back: Normal range of motion.     Comments: Patient rises from seated position to standing without difficulty. Good lumbar range of motion. No pain noted with facet loading. 5/5 strength noted with bilateral hip flexion, knee flexion/extension, ankle dorsiflexion/plantarflexion and EHL. No clonus noted bilaterally. No pain upon palpation of greater trochanters. No pain with internal/external rotation of bilateral hips. Sensation intact bilaterally. Tenderness noted upon palpation of bilateral lumbar paraspinal regions. Negative slump test bilaterally. Ambulates without aid, gait steady.     Skin:    General: Skin is warm and dry.     Capillary Refill: Capillary refill takes less than 2 seconds.  Neurological:     General: No focal deficit present.     Mental Status: He is alert and oriented to person, place, and time.  Psychiatric:        Mood and Affect: Mood normal.        Behavior: Behavior normal.     Ortho Exam  Imaging: XR Lumbar Spine Complete  Result Date: 12/23/2022 4 view lumbar spine radiographs exhibit normal anatomical alignment, well preserved disc spacing, no spondylolisthesis. There are pars defects at L5 bilaterally. Good joint spacing noted to hips bilaterally. No fractures or dislocations.    Past Medical/Family/Surgical/Social History: Medications & Allergies reviewed per  EMR, new medications updated. Patient Active Problem List   Diagnosis Date Noted   Depressive disorder 03/09/2015   Cocaine use disorder, moderate, dependence (HCC) 03/09/2015   Amphetamine use disorder, moderate (HCC) 03/09/2015   Severe single current episode of major depressive disorder, without psychotic features (HCC)    History reviewed. No pertinent past medical history. History reviewed. No pertinent family history. History reviewed. No pertinent surgical history. Social History   Occupational History   Not on file  Tobacco Use   Smoking status: Never   Smokeless tobacco: Not on file  Substance and Sexual Activity   Alcohol use: Yes    Comment: Yesterday last drink. Once every three months.    Drug use: No   Sexual activity: Not on file

## 2022-12-23 NOTE — Progress Notes (Unsigned)
Functional Pain Scale - descriptive words and definitions  Distracting (5)    Aware of pain/able to complete some ADL's but limited by pain/sleep is affected and active distractions are only slightly useful. Moderate range order  Average Pain  varies but can get to a 10  Lower back pain in the middle

## 2023-01-06 ENCOUNTER — Ambulatory Visit: Payer: Self-pay | Admitting: Physical Therapy

## 2023-01-06 NOTE — Therapy (Incomplete)
OUTPATIENT PHYSICAL THERAPY THORACOLUMBAR EVALUATION   Patient Name: Shane Olson MRN: [id number] DOB:10/22/1990, 32 y.o., male Today's Date: 01/06/2023  END OF SESSION:   No past medical history on file. No past surgical history on file. Patient Active Problem List   Diagnosis Date Noted   Depressive disorder 03/09/2015   Cocaine use disorder, moderate, dependence (HCC) 03/09/2015   Amphetamine use disorder, moderate (HCC) 03/09/2015   Severe single current episode of major depressive disorder, without psychotic features (HCC)     PCP: no PCP per chart  REFERRING PROVIDER: Juanda Chance, NP  REFERRING DIAG: M54.50,G89.29 (ICD-10-CM) - Chronic bilateral low back pain without sciatica M79.18 (ICD-10-CM) - Myofascial pain syndrome M43.06 (ICD-10-CM) - Pars defect of lumbar spine  Rationale for Evaluation and Treatment: Rehabilitation  THERAPY DIAG:  No diagnosis found.  ONSET DATE: ***  SUBJECTIVE:                                                                                                                                                                                           SUBJECTIVE STATEMENT: ***  PERTINENT HISTORY:  depression  PAIN:  Are you having pain: *** Location/description: *** Best-worst over past week: ***  - aggravating factors: *** - Easing factors: ***    PRECAUTIONS: None   WEIGHT BEARING RESTRICTIONS: No  FALLS:  Has patient fallen in last 6 months? {fallsyesno:27318}  LIVING ENVIRONMENT: Lives with: {[facility] lives with:25569::"lives with their family"} Lives in: {Lives in:25570} Stairs: {opstairs:27293} Has following equipment at home: {Assistive devices:23999}  OCCUPATION: ***  PLOF: {PLOF:24004}  PATIENT GOALS: ***  NEXT MD VISIT: ***  OBJECTIVE:  Note: Objective measures were completed at Evaluation unless otherwise noted.  DIAGNOSTIC FINDINGS:  12/23/22 lumbar XR (refer to EPIC for details) Per result  narrative: "4 view lumbar spine radiographs exhibit normal anatomical alignment, well  preserved disc spacing, no spondylolisthesis. There are pars defects at L5  bilaterally. Good joint spacing noted to hips bilaterally. No fractures or  dislocations. "  PATIENT SURVEYS:  FOTO ***  SCREENING FOR RED FLAGS: Red flag screening *** ( reassuring / concerning) - bowel/bladder changes: *** - bucking/overt weakness: *** - fevers/night sweats: *** - bilateral LE numbness: *** - unintentional weight loss: ***    COGNITION: Overall cognitive status: Within functional limits for tasks assessed     SENSATION: {sensation:27233}  MUSCLE LENGTH: Hamstrings: Right *** deg; Left *** deg Thomas test: Right *** deg; Left *** deg  POSTURE: {posture:25561}  PALPATION: ***  LUMBAR ROM:   AROM eval  Flexion   Extension   Right lateral flexion   Left lateral flexion   Right  rotation   Left rotation    (Blank rows = not tested) (Key: WFL = within functional limits not formally assessed, * = concordant pain, s = stiffness/stretching sensation, NT = not tested)   LOWER EXTREMITY ROM:     {AROM/PROM:27142}  Right eval Left eval  Hip flexion    Hip extension    Hip internal rotation    Hip external rotation    Knee extension    Knee flexion    (Blank rows = not tested) (Key: WFL = within functional limits not formally assessed, * = concordant pain, s = stiffness/stretching sensation, NT = not tested)  Comments:    LOWER EXTREMITY MMT:    MMT Right eval Left eval  Hip flexion    Hip abduction (modified sitting)    Hip internal rotation    Hip external rotation    Knee flexion    Knee extension    Ankle dorsiflexion     (Blank rows = not tested) (Key: WFL = within functional limits not formally assessed, * = concordant pain, s = stiffness/stretching sensation, NT = not tested)  Comments:    LUMBAR SPECIAL TESTS:  {lumbar special test:25242}  FUNCTIONAL TESTS:   {Functional tests:24029}  GAIT: Distance walked: within clinic Assistive device utilized: None Level of assistance: Complete Independence Comments: ***  TODAY'S TREATMENT:                                                                                                                              [facility] Adult PT Treatment:                                                DATE: 01/06/23 Therapeutic Exercise: *** Manual Therapy: *** Neuromuscular re-ed: *** Therapeutic Activity: *** Modalities: *** Self Care: ***    PATIENT EDUCATION:  Education details: Pt education on PT impairments, prognosis, and POC. Informed consent. Rationale for interventions, safe/appropriate HEP performance Person educated: Patient Education method: Explanation, Demonstration, Tactile cues, Verbal cues, and Handouts Education comprehension: verbalized understanding, returned demonstration, verbal cues required, tactile cues required, and needs further education    HOME EXERCISE PROGRAM: ***  ASSESSMENT:  CLINICAL IMPRESSION: Patient is a 32 y.o. gentleman who was seen today for physical therapy evaluation and treatment for chronic low back pain. ***    OBJECTIVE IMPAIRMENTS: {opptimpairments:25111}.   ACTIVITY LIMITATIONS: {activitylimitations:27494}  PARTICIPATION LIMITATIONS: {participationrestrictions:25113}  PERSONAL FACTORS: {Personal factors:25162} are also affecting patient's functional outcome.   REHAB POTENTIAL: {rehabpotential:25112}  CLINICAL DECISION MAKING: {clinical decision making:25114}  EVALUATION COMPLEXITY: {Evaluation complexity:25115}   GOALS: Goals reviewed with patient? {yes/no:20286}  SHORT TERM GOALS: Target date: ***  Pt will demonstrate appropriate understanding and performance of initially prescribed HEP in order to facilitate improved independence with management of symptoms.  Baseline: HEP provided on eval Goal status:  INITIAL   2. Pt will score greater  than or equal to *** on FOTO in order to demonstrate improved perception of function due to symptoms.  Baseline: ***  Goal status: INITIAL    LONG TERM GOALS: Target date: *** Pt will score *** on FOTO in order to demonstrate improved perception of functional status due to symptoms.  Baseline: *** Goal status: INITIAL  2.  Pt will demonstrate *** lumbar AROM in order to demonstrate improved tolerance to functional movement patterns.   Baseline: *** Goal status: INITIAL  3.  Pt will demonstrate hip MMT of *** in order to demonstrate improved strength for functional movements.  Baseline: *** Goal status: INITIAL  4. Pt will perform 5xSTS in <*** sec in order to demonstrate reduced fall risk and improved functional independence. (MCID of 2.3sec)  Baseline: ***  Goal status: INITIAL   PLAN:  PT FREQUENCY: {rehab frequency:25116}  PT DURATION: {rehab duration:25117}  PLANNED INTERVENTIONS: {rehab planned interventions:25118::"97110-Therapeutic exercises","97530- Therapeutic [phone]- Neuromuscular re-education","97535- Self QION","62952- Manual therapy"}.  PLAN FOR NEXT SESSION: Review/update HEP PRN. Work on [employer] exercises as appropriate with emphasis on ***. Symptom modification strategies as indicated/appropriate.    Ashley Murrain PT, DPT 01/06/2023 8:01 AM

## 2023-02-03 ENCOUNTER — Ambulatory Visit: Payer: Self-pay | Admitting: Physical Medicine and Rehabilitation

## 2023-11-07 ENCOUNTER — Encounter (HOSPITAL_BASED_OUTPATIENT_CLINIC_OR_DEPARTMENT_OTHER): Payer: Self-pay

## 2023-11-07 ENCOUNTER — Emergency Department (HOSPITAL_BASED_OUTPATIENT_CLINIC_OR_DEPARTMENT_OTHER)
Admission: EM | Admit: 2023-11-07 | Discharge: 2023-11-07 | Disposition: A | Discharge status: Home / Self Care | Payer: Self-pay | Attending: Emergency Medicine | Admitting: Emergency Medicine

## 2023-11-07 DIAGNOSIS — S1086XA Insect bite of other specified part of neck, initial encounter: Secondary | ICD-10-CM | POA: Insufficient documentation

## 2023-11-07 DIAGNOSIS — W57XXXA Bitten or stung by nonvenomous insect and other nonvenomous arthropods, initial encounter: Secondary | ICD-10-CM | POA: Insufficient documentation

## 2023-11-07 MED ORDER — DIPHENHYDRAMINE HCL 25 MG PO CAPS
25.0000 mg | ORAL_CAPSULE | Freq: Once | ORAL | Status: AC
  Administered 2023-11-07: 25 mg via ORAL
  Filled 2023-11-07: qty 1

## 2023-11-07 MED ORDER — DEXAMETHASONE SODIUM PHOSPHATE 10 MG/ML IJ SOLN
10.0000 mg | Freq: Once | INTRAMUSCULAR | Status: AC
  Administered 2023-11-07: 10 mg via INTRAMUSCULAR
  Filled 2023-11-07: qty 1

## 2023-11-07 NOTE — ED Provider Notes (Signed)
 [place] EMERGENCY DEPARTMENT AT [facility] Provider Note   CSN: [id number] Arrival date & time: 11/07/23  1932     Patient presents with: No chief complaint on file.   Shane Olson is a 33 y.o. male patient with past medical history of drug use and depression is reporting to emergency room with complaint of bug bite in which he thinks it was a spider.  Patient reports he saw and killed a small insect after he felt a bite near his neck. He reports this happened at 3.5 hrs ago. He is denying IVDU or very.  He reports he is experienced some itching sensation of his throat but denies any oral swelling.  He is not having any difficulty swallowing and he has normal phonation.  He has no other rash.  Denies abdominal cramping or nausea.  Has no known allergies. No fever.    HPI     Prior to Admission medications   Medication Sig Start Date End Date Taking? Authorizing Provider  FLUoxetine  (PROZAC ) 10 MG capsule Take 1 capsule (10 mg total) by mouth daily. 03/11/15   Secundino Cones, NP  hydrOXYzine  (ATARAX /VISTARIL ) 25 MG tablet Take 1 tablet (25 mg total) by mouth 3 (three) times daily as needed for anxiety. 03/11/15   Secundino Cones, NP  meloxicam  (MOBIC ) 15 MG tablet Take 1 tablet (15 mg total) by mouth daily. 12/23/22 12/23/23  Williams, Megan E, NP  methocarbamol  (ROBAXIN ) 500 MG tablet Take 1 tablet (500 mg total) by mouth 3 (three) times daily. 12/23/22   Williams, Megan E, NP  traZODone  (DESYREL ) 50 MG tablet Take 1 tablet (50 mg total) by mouth at bedtime as needed for sleep (May repeat x1). 03/11/15   Secundino Cones, NP    Allergies: Patient has no known allergies.    Review of Systems  Skin:  Positive for rash.    Updated Vital Signs BP (!) 142/93   Pulse 75   Temp 98 F (36.7 C) (Oral)   Resp 18   Ht 5' 11 (1.803 m)   Wt 87.1 kg   SpO2 100%   BMI 26.78 kg/m   Physical Exam Vitals and nursing note reviewed.  Constitutional:      General: He is not  in acute distress.    Appearance: He is not toxic-appearing.  HENT:     Head: Normocephalic and atraumatic.  Eyes:     General: No scleral icterus.    Conjunctiva/sclera: Conjunctivae normal.  Cardiovascular:     Rate and Rhythm: Normal rate and regular rhythm.     Pulses: Normal pulses.     Heart sounds: Normal heart sounds.  Pulmonary:     Effort: Pulmonary effort is normal. No respiratory distress.     Breath sounds: Normal breath sounds.  Abdominal:     General: Abdomen is flat. Bowel sounds are normal.     Palpations: Abdomen is soft.     Tenderness: There is no abdominal tenderness.  Skin:    General: Skin is warm and dry.     Findings: No lesion.     Comments: Small approx. 1 cm red nodule under left ear, no area of fluctuance and no surrounding erythema.  Neurological:     General: No focal deficit present.     Mental Status: He is alert and oriented to person, place, and time. Mental status is at baseline.     (all labs ordered are listed, but only abnormal results are displayed) Labs Reviewed - No data  to display  EKG: None  Radiology: No results found.   Procedures   Medications Ordered in the ED  dexamethasone  (DECADRON ) injection 10 mg (10 mg Intramuscular Given 11/07/23 2000)  diphenhydrAMINE  (BENADRYL ) capsule 25 mg (25 mg Oral Given 11/07/23 2000)    Clinical Course as of 11/07/23 2101  Tue Nov 07, 2023  2101 Patient was reassessed.  Reports symptoms are much better.  Requesting discharge. [JB]    Clinical Course User Index [JB] Rema Lievanos, Warren SAILOR, PA-C                                 Medical Decision Making Risk Prescription drug management.   This patient presents to the ED for concern of possible bug bite, this involves an extensive number of treatment options, and is a complaint that carries with it a high risk of complications and morbidity.  The differential diagnosis includes abscess, cellulitis, septic emboli, allergic  reaction   Problem List / ED Course / Critical interventions / Medication management  Patient reports a possible bug bite to left side of her neck just below his ear.  On exam he has a small nodule.  There is no area of fluctuance to indicate that this is abscess.  He has no significant surrounding cellulitis.  Vital signs are stable.  He is supporting his airway.  He has no oral swelling, tongue swelling.  He has normal phonation and is handling secretions.  No sign of anaphylaxis.  Sounds like he had allergic reaction mild in nature to a possible bug bite.  He was given Benadryl  and Decadron  with complete resolution of symptoms. On reevaluation patient still feeling well.  He is requesting discharge.  He was observed for extended period of time in the ER.  Feel appropriate for discharge with outpatient follow-up.       Final diagnoses:  Bug bite, initial encounter    ED Discharge Orders     None          Shermon Warren SAILOR RIGGERS 11/07/23 2212    Jerrol Agent, MD 11/07/23 [phone]

## 2023-11-07 NOTE — ED Triage Notes (Signed)
 Patient reports spider bite occurring at 1600 today. It is on his left neck. He reports some burning and the sensation of his throat closing but he has no difficulty breathing or swallowing at this time. There is redness and some discoloration where the bite occurred.

## 2023-11-07 NOTE — Discharge Instructions (Signed)
 These follow-up with primary care for recheck of symptoms and return to emergency room with new or worsening symptoms like difficulty breathing, throat swelling or shortness of breath.  You can trial over-the-counter hydrocortisone cream and Benadryl  as needed.

## 2023-11-07 NOTE — ED Notes (Signed)
 Pt given discharge instructions. Opportunities given for questions. Pt stable at time of discharge.

## 2024-01-22 ENCOUNTER — Encounter: Payer: Self-pay | Admitting: Radiology
